# Patient Record
Sex: Male | Born: 1990
Health system: Southern US, Community
[De-identification: ages and names within clinical notes are randomized; demographics above are authoritative.]

## PROBLEM LIST (undated history)

## (undated) DIAGNOSIS — K589 Irritable bowel syndrome without diarrhea: Secondary | ICD-10-CM

## (undated) DIAGNOSIS — I10 Essential (primary) hypertension: Secondary | ICD-10-CM

## (undated) HISTORY — DX: Irritable bowel syndrome, unspecified: K58.9

## (undated) HISTORY — PX: TONSILLECTOMY: SUR1361

## (undated) HISTORY — PX: WISDOM TOOTH EXTRACTION: SHX21

## (undated) HISTORY — PX: APPENDECTOMY: SHX54

## (undated) SURGERY — EGD (ESOPHAGOGASTRODUODENOSCOPY)
Anesthesia: Moderate Sedation

---
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
## 2009-07-25 ENCOUNTER — Emergency Department: Payer: Self-pay | Admitting: Emergency Medicine

## 2009-10-19 ENCOUNTER — Emergency Department: Payer: Self-pay | Admitting: Emergency Medicine

## 2010-05-14 ENCOUNTER — Emergency Department: Payer: Self-pay | Admitting: Emergency Medicine

## 2011-10-23 ENCOUNTER — Emergency Department: Payer: Self-pay | Admitting: *Deleted

## 2012-01-01 ENCOUNTER — Emergency Department (INDEPENDENT_AMBULATORY_CARE_PROVIDER_SITE_OTHER)
Admission: EM | Admit: 2012-01-01 | Discharge: 2012-01-01 | Disposition: A | Discharge status: Home / Self Care | Payer: Self-pay | Source: Home / Self Care

## 2012-01-01 ENCOUNTER — Encounter (HOSPITAL_COMMUNITY): Payer: Self-pay | Admitting: Emergency Medicine

## 2012-01-01 ENCOUNTER — Emergency Department (HOSPITAL_COMMUNITY)
Admission: EM | Admit: 2012-01-01 | Discharge: 2012-01-01 | Disposition: A | Discharge status: Home / Self Care | Payer: Self-pay | Attending: Emergency Medicine | Admitting: Emergency Medicine

## 2012-01-01 DIAGNOSIS — IMO0002 Reserved for concepts with insufficient information to code with codable children: Secondary | ICD-10-CM | POA: Insufficient documentation

## 2012-01-01 DIAGNOSIS — I10 Essential (primary) hypertension: Secondary | ICD-10-CM | POA: Insufficient documentation

## 2012-01-01 DIAGNOSIS — Y9389 Activity, other specified: Secondary | ICD-10-CM | POA: Insufficient documentation

## 2012-01-01 DIAGNOSIS — K222 Esophageal obstruction: Secondary | ICD-10-CM

## 2012-01-01 DIAGNOSIS — F17200 Nicotine dependence, unspecified, uncomplicated: Secondary | ICD-10-CM | POA: Insufficient documentation

## 2012-01-01 DIAGNOSIS — T18108A Unspecified foreign body in esophagus causing other injury, initial encounter: Secondary | ICD-10-CM | POA: Insufficient documentation

## 2012-01-01 DIAGNOSIS — Y929 Unspecified place or not applicable: Secondary | ICD-10-CM | POA: Insufficient documentation

## 2012-01-01 HISTORY — DX: Essential (primary) hypertension: I10

## 2012-01-01 MED ORDER — DIAZEPAM 5 MG/ML IJ SOLN
5.0000 mg | Freq: Once | INTRAMUSCULAR | Status: AC
  Administered 2012-01-01: 5 mg via INTRAVENOUS
  Filled 2012-01-01: qty 2

## 2012-01-01 MED ORDER — GLUCAGON HCL (RDNA) 1 MG IJ SOLR
1.0000 mg | Freq: Once | INTRAMUSCULAR | Status: AC
  Administered 2012-01-01: 1 mg via INTRAVENOUS
  Filled 2012-01-01: qty 1

## 2012-01-01 NOTE — ED Provider Notes (Signed)
History     CSN: [id number]  Arrival date & time 01/01/12  1330   None     Chief Complaint  Patient presents with  . Cough    (Consider location/radiation/quality/duration/timing/severity/associated sxs/prior treatment) HPI Comments: Pleasant 21 year old male who was attempting to be a small piece of chicken this morning and upon swallowing it he immediately regurgitated it. He saw some speck of blood on the initial regurgitation but no bleeding since then. He later attempted to sip on some hot tea but was unable to keep that down low and then just a few seconds he quickly regurgitated that. He is also tried to drink water and that "comes right back up". He is unable to maintain control of salivary secretions without regurgitating that. He was given a cup of water in the office after 2 swallows he had to gag himself to regurgitate the water because it was causing some chest discomfort. He has no shortness of breath, cough, fever or upper respiratory infection symptoms. Denies abdominal pain.   Past Medical History  Diagnosis Date  . Hypertension     Past Surgical History  Procedure Date  . Appendectomy   . Tonsillectomy     No family history on file.  History  Substance Use Topics  . Smoking status: Current Some Day Smoker  . Smokeless tobacco: Not on file  . Alcohol Use: Yes      Review of Systems  Gastrointestinal:       As per history of present illness  All other systems reviewed and are negative.    Allergies  Review of patient's allergies indicates no known allergies.  Home Medications  No current outpatient prescriptions on file.  BP 159/97  Pulse 85  Temp 98 F (36.7 C) (Oral)  Resp 20  SpO2 98%  Physical Exam  Constitutional: He is oriented to person, place, and time. He appears well-developed and well-nourished. No distress.  HENT:  Head: Normocephalic and atraumatic.  Right Ear: External ear normal.  Left Ear: External ear normal.    Mouth/Throat: Oropharynx is clear and moist. No oropharyngeal exudate.  Eyes: Conjunctivae normal and EOM are normal.  Neck: Normal range of motion. Neck supple.  Cardiovascular: Normal rate, regular rhythm and normal heart sounds.   Pulmonary/Chest: Effort normal and breath sounds normal. No respiratory distress. He has no wheezes.  Abdominal: Soft. He exhibits no distension and no mass. There is no rebound and no guarding.       Mild generalized tenderness of the abdomen a little more so in the epigastrium.  Musculoskeletal: Normal range of motion. He exhibits no edema and no tenderness.  Lymphadenopathy:    He has no cervical adenopathy.  Neurological: He is alert and oriented to person, place, and time. No cranial nerve deficit.  Skin: Skin is warm and dry. No rash noted.  Psychiatric: He has a normal mood and affect.    ED Course  Procedures (including critical care time)  Labs Reviewed - No data to display No results found.   1. Acute esophageal obstruction       MDM  Into the emergency department for evaluation and management of what is most likely an esophageal obstruction. He is in no distress. He is stable. He is maintaining his airway quite well. He is active awake alert without shortness of breath, cough or other airway or pulmonary symptoms.        Hayden Rasmussen, NP 01/01/12 [phone]

## 2012-01-01 NOTE — ED Notes (Signed)
Eating bite sized pieces of chicken this am, after first bite , has not been able to hold down liquids or food.  Denies nausea.

## 2012-01-01 NOTE — ED Provider Notes (Signed)
History     CSN: [id number]  Arrival date & time 01/01/12  1423   First MD Initiated Contact with Patient 01/01/12 1505      No chief complaint on file.   (Consider location/radiation/quality/duration/timing/severity/associated sxs/prior treatment) HPI Comments: Patient presents with inability to swallow food, liquid, or saliva since eating a piece of chicken this morning.  Anything he eats or drinks he throws back up.  He was seen at urgent care and sent here.  He denies fevers or chills.  This has happened in the past but has never lasted this long.  The history is provided by the patient.    Past Medical History  Diagnosis Date  . Hypertension     Past Surgical History  Procedure Date  . Appendectomy   . Tonsillectomy     No family history on file.  History  Substance Use Topics  . Smoking status: Current Some Day Smoker  . Smokeless tobacco: Not on file  . Alcohol Use: Yes      Review of Systems  All other systems reviewed and are negative.    Allergies  Review of patient's allergies indicates no known allergies.  Home Medications  No current outpatient prescriptions on file.  BP 160/93  Pulse 97  Temp 98.2 F (36.8 C)  Resp 16  SpO2 98%  Physical Exam  Nursing note and vitals reviewed. Constitutional: He is oriented to person, place, and time. He appears well-developed and well-nourished. No distress.  HENT:  Head: Normocephalic and atraumatic.  Mouth/Throat: Oropharynx is clear and moist. No oropharyngeal exudate.  Neck: Normal range of motion.  Cardiovascular: Normal rate and regular rhythm.   Pulmonary/Chest: Effort normal and breath sounds normal.  Abdominal: Soft. Bowel sounds are normal.  Musculoskeletal: Normal range of motion.  Neurological: He is alert and oriented to person, place, and time.  Skin: Skin is warm and dry. He is not diaphoretic.    ED Course  Procedures (including critical care time)  Labs Reviewed - No data to  display No results found.   No diagnosis found.    MDM  Was given glucagon, valium, and cola which resolved the obstruction.  He is now able to take fluids without difficulty.  Will discharge to home, to return prn.  Needs to see GI as this has occurred before.  Will give GI contact information.        Geoffery Lyons, MD 01/01/12 [phone]

## 2012-01-01 NOTE — ED Notes (Signed)
Pt given a can of coke to drink, pt was able to swallow fluids with out gagging or spitting them back up. Pt reports he feels a lot better and doesn't have the feeling like something is stuck in his throat anymore.

## 2012-01-01 NOTE — ED Notes (Signed)
Pt reports he isn't able to keep any food or fluids down. Pt denies nausea. Pt c/o hard time getting food and fluids down to stomach, when he drinks water he spits it up immediately. Pt denies throat pain. Pt able to speak in full sentences, pt denies SOB. Pt reports it feels like something needs to come out.

## 2012-01-01 NOTE — ED Notes (Signed)
Throat pain  Since this am went to ucc and was sent for further test ? Throat stricture is able to swallow saliva and breath cannot swallow food

## 2012-01-01 NOTE — ED Provider Notes (Signed)
Medical screening examination/treatment/procedure(s) were performed by resident physician or non-physician practitioner and as supervising physician I was immediately available for consultation/collaboration.   Larrie Fraizer DOUGLAS MD.    Japneet Staggs D Moriya Mitchell, MD 01/01/12 1656 

## 2012-07-18 ENCOUNTER — Emergency Department: Payer: Self-pay | Admitting: Emergency Medicine

## 2012-07-18 LAB — BASIC METABOLIC PANEL
Calcium, Total: 9.1 mg/dL (ref 8.5–10.1)
Creatinine: 0.94 mg/dL (ref 0.60–1.30)
Glucose: 102 mg/dL — ABNORMAL HIGH (ref 65–99)
Sodium: 141 mmol/L (ref 136–145)

## 2012-07-18 LAB — CBC
HCT: 43.6 % (ref 40.0–52.0)
HGB: 15 g/dL (ref 13.0–18.0)
MCH: 32.6 pg (ref 26.0–34.0)
MCHC: 34.3 g/dL (ref 32.0–36.0)
Platelet: 227 10*3/uL (ref 150–440)
WBC: 7.6 10*3/uL (ref 3.8–10.6)

## 2012-10-23 ENCOUNTER — Emergency Department (HOSPITAL_COMMUNITY)
Admission: EM | Admit: 2012-10-23 | Discharge: 2012-10-23 | Disposition: A | Discharge status: Home / Self Care | Payer: 59 | Attending: Emergency Medicine | Admitting: Emergency Medicine

## 2012-10-23 ENCOUNTER — Encounter (HOSPITAL_COMMUNITY): Payer: Self-pay | Admitting: Emergency Medicine

## 2012-10-23 DIAGNOSIS — J069 Acute upper respiratory infection, unspecified: Secondary | ICD-10-CM | POA: Insufficient documentation

## 2012-10-23 DIAGNOSIS — R519 Headache, unspecified: Secondary | ICD-10-CM

## 2012-10-23 DIAGNOSIS — R6883 Chills (without fever): Secondary | ICD-10-CM | POA: Insufficient documentation

## 2012-10-23 DIAGNOSIS — F17200 Nicotine dependence, unspecified, uncomplicated: Secondary | ICD-10-CM | POA: Insufficient documentation

## 2012-10-23 DIAGNOSIS — I10 Essential (primary) hypertension: Secondary | ICD-10-CM | POA: Insufficient documentation

## 2012-10-23 DIAGNOSIS — R51 Headache: Secondary | ICD-10-CM | POA: Insufficient documentation

## 2012-10-23 MED ORDER — DIPHENHYDRAMINE HCL 50 MG/ML IJ SOLN
25.0000 mg | Freq: Once | INTRAMUSCULAR | Status: AC
  Administered 2012-10-23: 25 mg via INTRAVENOUS
  Filled 2012-10-23: qty 1

## 2012-10-23 MED ORDER — SALINE SPRAY 0.65 % NA SOLN
1.0000 | Freq: Once | NASAL | Status: AC
  Administered 2012-10-23: 1 via NASAL
  Filled 2012-10-23: qty 44

## 2012-10-23 MED ORDER — METOCLOPRAMIDE HCL 5 MG/ML IJ SOLN
5.0000 mg | Freq: Once | INTRAMUSCULAR | Status: AC
  Administered 2012-10-23: 5 mg via INTRAVENOUS
  Filled 2012-10-23: qty 2

## 2012-10-23 MED ORDER — SODIUM CHLORIDE 0.9 % IV BOLUS (SEPSIS)
1000.0000 mL | Freq: Once | INTRAVENOUS | Status: AC
  Administered 2012-10-23: 1000 mL via INTRAVENOUS

## 2012-10-23 NOTE — ED Provider Notes (Signed)
CSN: [id number]     Arrival date & time 10/23/12  0854 History   First MD Initiated Contact with Patient 10/23/12 [phone]     Chief Complaint  Patient presents with  . Headache   (Consider location/radiation/quality/duration/timing/severity/associated sxs/prior Treatment) HPI  This a 22 year old male with a past medical history significant for hypertension who presents with headache. Patient reports onset of headache, chills, and nasal congestion on Sunday. He states he forgot Sunday night with a headache and took a shower. He received a flu shot on Monday morning at work as he is a Research scientist (physical sciences) at [employer]. Patient states that since he got a flu shot he has had continuous frontal headache. He denies any fevers. He has tried Tylenol, Sudafed, and Mucinex for his symptoms. Patient denies any vision changes, focal weakness, or numbness. Patient further denies any shortness of breath, chest pain, abdominal pain, urinary symptoms.  Past Medical History  Diagnosis Date  . Hypertension    Past Surgical History  Procedure Laterality Date  . Appendectomy    . Tonsillectomy     History reviewed. No pertinent family history. History  Substance Use Topics  . Smoking status: Current Some Day Smoker  . Smokeless tobacco: Not on file  . Alcohol Use: Yes    Review of Systems  Constitutional: Positive for chills. Negative for fever.  HENT: Positive for congestion and rhinorrhea. Negative for ear pain, neck pain and neck stiffness.   Eyes: Negative for visual disturbance.  Respiratory: Negative.  Negative for chest tightness and shortness of breath.   Cardiovascular: Negative.  Negative for chest pain.  Gastrointestinal: Negative.  Negative for nausea, vomiting and abdominal pain.  Genitourinary: Negative.   Musculoskeletal: Negative for myalgias.  Skin: Negative for rash.  Neurological: Positive for headaches. Negative for dizziness, syncope and weakness.  All other systems  reviewed and are negative.    Allergies  Review of patient's allergies indicates no known allergies.  Home Medications   Current Outpatient Rx  Name  Route  Sig  Dispense  Refill  . acetaminophen (TYLENOL) 500 MG tablet   Oral   Take 1,000 mg by mouth every 6 (six) hours as needed for pain.         Marland Kitchen guaiFENesin (ROBITUSSIN) 100 MG/5ML liquid   Oral   Take 300 mg by mouth 3 (three) times daily as needed for cough.         . pseudoephedrine (SUDAFED) 30 MG tablet   Oral   Take 30 mg by mouth every 4 (four) hours as needed for congestion.          BP 151/87  Pulse 68  Temp(Src) 98.4 F (36.9 C) (Oral)  Resp 22  SpO2 100% Physical Exam  Nursing note and vitals reviewed. Constitutional: He is oriented to person, place, and time. He appears well-developed and well-nourished.  HENT:  Head: Normocephalic and atraumatic.  Left Ear: External ear normal.  Mouth/Throat: Oropharynx is clear and moist.  Tenderness palpation over the frontal sinus, right TM with scarring  Eyes: EOM are normal. Pupils are equal, round, and reactive to light.  Neck: Neck supple.  No meningismus  Cardiovascular: Normal rate, regular rhythm and normal heart sounds.   No murmur heard. Pulmonary/Chest: Effort normal and breath sounds normal. No respiratory distress. He has no wheezes.  Abdominal: Soft. Bowel sounds are normal. There is no tenderness. There is no rebound.  Musculoskeletal: He exhibits no edema.  Lymphadenopathy:    He has no cervical  adenopathy.  Neurological: He is alert and oriented to person, place, and time. No cranial nerve deficit. Coordination normal.  Coordination intact finger-nose-finger, 5 out of 5 strength in all 4 extremities, visual fields intact  Skin: Skin is warm and dry. No rash noted.  Psychiatric: He has a normal mood and affect.    ED Course  Procedures (including critical care time) Labs Review Labs Reviewed - No data to display Imaging Review No  results found.  MDM   1. URI (upper respiratory infection)   2. Headache    The patient is nonfocal and nontoxic appearing on exam. Symptoms suggestive of upper respiratory infection.  Patient denies any neck pain or meningismus. He denies worse headache of his life. Patient was given headache cocktail and saline nasal spray. Patient had complete resolution of his symptoms in the ER.  Patient was educated on symptomatic management. He will be discharged home  After history, exam, and medical workup I feel the patient has been appropriately medically screened and is safe for discharge home. Pertinent diagnoses were discussed with the patient. Patient was given return precautions.   Shon Baton, MD 10/23/12 [phone]

## 2012-10-23 NOTE — ED Notes (Signed)
Pt arrives to ed c/o HA onset Sunday. Pt sts "cold then hot" at night. Pt sts nasal drainage x 2 days. Pt denies hx migraines, n/v/d, change in vision, recent illness/injury. Caox4, pmsx4, nad.

## 2013-07-29 ENCOUNTER — Emergency Department (HOSPITAL_COMMUNITY)
Admission: EM | Admit: 2013-07-29 | Discharge: 2013-07-29 | Disposition: A | Discharge status: Home / Self Care | Payer: 59 | Attending: Emergency Medicine | Admitting: Emergency Medicine

## 2013-07-29 ENCOUNTER — Encounter (HOSPITAL_COMMUNITY): Payer: Self-pay | Admitting: Emergency Medicine

## 2013-07-29 DIAGNOSIS — Y9289 Other specified places as the place of occurrence of the external cause: Secondary | ICD-10-CM | POA: Insufficient documentation

## 2013-07-29 DIAGNOSIS — IMO0002 Reserved for concepts with insufficient information to code with codable children: Secondary | ICD-10-CM | POA: Insufficient documentation

## 2013-07-29 DIAGNOSIS — Y9389 Activity, other specified: Secondary | ICD-10-CM | POA: Insufficient documentation

## 2013-07-29 DIAGNOSIS — K222 Esophageal obstruction: Secondary | ICD-10-CM

## 2013-07-29 DIAGNOSIS — I10 Essential (primary) hypertension: Secondary | ICD-10-CM | POA: Insufficient documentation

## 2013-07-29 DIAGNOSIS — T18128A Food in esophagus causing other injury, initial encounter: Secondary | ICD-10-CM

## 2013-07-29 DIAGNOSIS — R1110 Vomiting, unspecified: Secondary | ICD-10-CM | POA: Insufficient documentation

## 2013-07-29 DIAGNOSIS — T18108A Unspecified foreign body in esophagus causing other injury, initial encounter: Secondary | ICD-10-CM | POA: Insufficient documentation

## 2013-07-29 MED ORDER — GLUCAGON HCL RDNA (DIAGNOSTIC) 1 MG IJ SOLR
1.0000 mg | Freq: Once | INTRAMUSCULAR | Status: AC
  Administered 2013-07-29: 1 mg via INTRAMUSCULAR
  Filled 2013-07-29: qty 1

## 2013-07-29 NOTE — Discharge Instructions (Signed)
Take small bites and eat slowly. Make appointment to followup with gastroenterologist. Return immediately for persistent vomiting or for any concerns.

## 2013-07-29 NOTE — ED Notes (Addendum)
Patient presents stating he has something stuck in his throat.  ?chicken or rice  No resp distress noted  Patient unable to swallow all his mucous

## 2013-07-29 NOTE — ED Provider Notes (Signed)
CSN: [id number]     Arrival date & time 07/29/13  0501 History   First MD Initiated Contact with Patient 07/29/13 [phone]     Chief Complaint  Patient presents with  . Food stuck in throat      (Consider location/radiation/quality/duration/timing/severity/associated sxs/prior Treatment) HPI Patient states he was eating chicken and rice around 4:30 this morning and felt it stuck in his throat. This has happened in the past. States he is unable to eat or drink anything. Tolerating secretions. He has no respiratory distress is not complaining of any shortness of breath. Has never seen a gastroenterologist or had endoscopy. Past Medical History  Diagnosis Date  . Hypertension    Past Surgical History  Procedure Laterality Date  . Appendectomy    . Tonsillectomy     History reviewed. No pertinent family history. History  Substance Use Topics  . Smoking status: Current Some Day Smoker  . Smokeless tobacco: Not on file  . Alcohol Use: Yes    Review of Systems  Constitutional: Negative for fever and chills.  HENT: Positive for trouble swallowing.   Respiratory: Negative for cough, shortness of breath, wheezing and stridor.   Cardiovascular: Negative for chest pain, palpitations and leg swelling.  Gastrointestinal: Positive for vomiting. Negative for nausea, abdominal pain and diarrhea.  Genitourinary: Negative for dysuria.  Musculoskeletal: Negative for back pain, neck pain and neck stiffness.  Skin: Negative for rash and wound.  Neurological: Negative for dizziness, weakness, light-headedness, numbness and headaches.  All other systems reviewed and are negative.     Allergies  Review of patient's allergies indicates no known allergies.  Home Medications   Prior to Admission medications   Medication Sig Start Date End Date Taking? Authorizing Shellsea Borunda  acetaminophen (TYLENOL) 500 MG tablet Take 1,000 mg by mouth every 6 (six) hours as needed for pain.    Historical Clif Serio,  MD  guaiFENesin (ROBITUSSIN) 100 MG/5ML liquid Take 300 mg by mouth 3 (three) times daily as needed for cough.    Historical Jerald Hennington, MD  pseudoephedrine (SUDAFED) 30 MG tablet Take 30 mg by mouth every 4 (four) hours as needed for congestion.    Historical Tyrick Dunagan, MD   BP 147/81  Pulse 87  Temp(Src) 98.6 F (37 C) (Oral)  Resp 17  Ht 6\' 2"  (1.88 m)  Wt 188 lb (85.276 kg)  BMI 24.13 kg/m2  SpO2 100% Physical Exam  Nursing note and vitals reviewed. Constitutional: He is oriented to person, place, and time. He appears well-developed and well-nourished. No distress.  Speaking clearly. Tolerating secretions. In no apparent distress.  HENT:  Head: Normocephalic and atraumatic.  Mouth/Throat: Oropharynx is clear and moist.  Eyes: EOM are normal. Pupils are equal, round, and reactive to light.  Neck: Normal range of motion. Neck supple.  Cardiovascular: Normal rate and regular rhythm.  Exam reveals no gallop and no friction rub.   No murmur heard. Pulmonary/Chest: Effort normal and breath sounds normal. No respiratory distress. He has no wheezes. He has no rales. He exhibits no tenderness.  Abdominal: Soft. Bowel sounds are normal. He exhibits no distension and no mass. There is no tenderness. There is no rebound and no guarding.  Musculoskeletal: Normal range of motion. He exhibits no edema and no tenderness.  Neurological: He is alert and oriented to person, place, and time.  Skin: Skin is warm and dry. No rash noted. No erythema.  Psychiatric: He has a normal mood and affect. His behavior is normal.    ED  Course  Procedures (including critical care time) Labs Review Labs Reviewed - No data to display  Imaging Review No results found.   EKG Interpretation None      MDM   Final diagnoses:  None      Patient is tolerating fluids. No more vomiting in the emergency department. States he feels the food has passed. He has been advised to eat small bites slowly. Given the  recurrent nature of these obstructions, i believe he should also followup with gastroenterology. He is aware of this. Return precautions have been given and the patient is voiced understanding.  Loren Raceravid Yelverton, MD 07/29/13 [phone]

## 2013-08-12 ENCOUNTER — Other Ambulatory Visit: Payer: Self-pay | Admitting: Family Medicine

## 2013-08-12 LAB — CBC WITH DIFFERENTIAL/PLATELET
BASOS ABS: 0.1 10*3/uL (ref 0.0–0.1)
Basophil %: 1.2 %
EOS ABS: 0.4 10*3/uL (ref 0.0–0.7)
EOS PCT: 6.3 %
HCT: 43.4 % (ref 40.0–52.0)
HGB: 14.5 g/dL (ref 13.0–18.0)
LYMPHS ABS: 2.5 10*3/uL (ref 1.0–3.6)
LYMPHS PCT: 35.1 %
MCH: 32.7 pg (ref 26.0–34.0)
MCHC: 33.4 g/dL (ref 32.0–36.0)
MCV: 98 fL (ref 80–100)
MONO ABS: 0.4 x10 3/mm (ref 0.2–1.0)
MONOS PCT: 5.6 %
NEUTROS PCT: 51.8 %
Neutrophil #: 3.6 10*3/uL (ref 1.4–6.5)
PLATELETS: 261 10*3/uL (ref 150–440)
RBC: 4.43 10*6/uL (ref 4.40–5.90)
RDW: 13 % (ref 11.5–14.5)
WBC: 7 10*3/uL (ref 3.8–10.6)

## 2013-08-12 LAB — COMPREHENSIVE METABOLIC PANEL
ALBUMIN: 4 g/dL (ref 3.4–5.0)
ALK PHOS: 45 U/L
ALT: 29 U/L (ref 12–78)
Anion Gap: 7 (ref 7–16)
BUN: 15 mg/dL (ref 7–18)
Bilirubin,Total: 0.3 mg/dL (ref 0.2–1.0)
CALCIUM: 9.2 mg/dL (ref 8.5–10.1)
CO2: 30 mmol/L (ref 21–32)
CREATININE: 1.04 mg/dL (ref 0.60–1.30)
Chloride: 103 mmol/L (ref 98–107)
EGFR (African American): >60
EGFR (Non-African Amer.): >60
Glucose: 90 mg/dL (ref 65–99)
Osmolality: 280 (ref 275–301)
POTASSIUM: 3.9 mmol/L (ref 3.5–5.1)
SGOT(AST): 18 U/L (ref 15–37)
SODIUM: 140 mmol/L (ref 136–145)
TOTAL PROTEIN: 7.4 g/dL (ref 6.4–8.2)

## 2013-08-12 LAB — LIPID PANEL
Cholesterol: 154 mg/dL (ref 0–200)
HDL Cholesterol: 51 mg/dL (ref 40–60)
Ldl Cholesterol, Calc: 93 mg/dL (ref 0–100)
Triglycerides: 52 mg/dL (ref 0–200)
VLDL CHOLESTEROL, CALC: 10 mg/dL (ref 5–40)

## 2013-08-12 LAB — TSH: Thyroid Stimulating Horm: 1.19 u[IU]/mL

## 2013-08-12 LAB — HEMOGLOBIN A1C: HEMOGLOBIN A1C: 5.8 % (ref 4.2–6.3)

## 2014-07-28 ENCOUNTER — Emergency Department (HOSPITAL_COMMUNITY): Payer: 59 | Admitting: Certified Registered"

## 2014-07-28 ENCOUNTER — Ambulatory Visit (HOSPITAL_COMMUNITY)
Admission: EM | Admit: 2014-07-28 | Discharge: 2014-07-28 | Disposition: A | Discharge status: Home / Self Care | Payer: 59 | Attending: Emergency Medicine | Admitting: Emergency Medicine

## 2014-07-28 ENCOUNTER — Encounter (HOSPITAL_COMMUNITY)
Admission: EM | Disposition: A | Discharge status: Home / Self Care | Payer: Self-pay | Source: Home / Self Care | Attending: Emergency Medicine

## 2014-07-28 ENCOUNTER — Encounter (HOSPITAL_COMMUNITY): Payer: Self-pay | Admitting: Emergency Medicine

## 2014-07-28 DIAGNOSIS — I10 Essential (primary) hypertension: Secondary | ICD-10-CM | POA: Insufficient documentation

## 2014-07-28 DIAGNOSIS — R1310 Dysphagia, unspecified: Secondary | ICD-10-CM

## 2014-07-28 DIAGNOSIS — X58XXXA Exposure to other specified factors, initial encounter: Secondary | ICD-10-CM | POA: Diagnosis not present

## 2014-07-28 DIAGNOSIS — T18128A Food in esophagus causing other injury, initial encounter: Secondary | ICD-10-CM | POA: Insufficient documentation

## 2014-07-28 DIAGNOSIS — Z87891 Personal history of nicotine dependence: Secondary | ICD-10-CM | POA: Insufficient documentation

## 2014-07-28 HISTORY — PX: FOREIGN BODY REMOVAL: SHX962

## 2014-07-28 HISTORY — PX: ESOPHAGOGASTRODUODENOSCOPY: SHX5428

## 2014-07-28 SURGERY — EGD (ESOPHAGOGASTRODUODENOSCOPY)
Anesthesia: Monitor Anesthesia Care

## 2014-07-28 MED ORDER — LACTATED RINGERS IV SOLN
INTRAVENOUS | Status: DC | PRN
  Administered 2014-07-28: 13:00:00 via INTRAVENOUS

## 2014-07-28 MED ORDER — GI COCKTAIL ~~LOC~~
30.0000 mL | Freq: Once | ORAL | Status: AC
  Administered 2014-07-28: 30 mL via ORAL
  Filled 2014-07-28: qty 30

## 2014-07-28 MED ORDER — NITROGLYCERIN 0.4 MG SL SUBL
0.4000 mg | SUBLINGUAL_TABLET | SUBLINGUAL | Status: DC | PRN
  Administered 2014-07-28: 0.4 mg via SUBLINGUAL
  Filled 2014-07-28: qty 1

## 2014-07-28 MED ORDER — SODIUM CHLORIDE 0.9 % IV SOLN: INTRAVENOUS | Status: DC

## 2014-07-28 MED ORDER — GLUCAGON HCL RDNA (DIAGNOSTIC) 1 MG IJ SOLR
1.0000 mg | Freq: Once | INTRAMUSCULAR | Status: AC
  Administered 2014-07-28: 1 mg via INTRAVENOUS
  Filled 2014-07-28: qty 1

## 2014-07-28 MED ORDER — PROPOFOL INFUSION 10 MG/ML OPTIME
INTRAVENOUS | Status: DC | PRN
  Administered 2014-07-28: 75 ug/kg/min via INTRAVENOUS

## 2014-07-28 MED ORDER — PROPOFOL 10 MG/ML IV BOLUS
INTRAVENOUS | Status: DC | PRN
  Administered 2014-07-28: 20 mg via INTRAVENOUS
  Administered 2014-07-28: 30 mg via INTRAVENOUS

## 2014-07-28 MED ORDER — MIDAZOLAM HCL 2 MG/2ML IJ SOLN
INTRAMUSCULAR | Status: DC | PRN
  Administered 2014-07-28: 2 mg via INTRAVENOUS

## 2014-07-28 MED ORDER — SODIUM CHLORIDE 0.9 % IV BOLUS (SEPSIS)
500.0000 mL | Freq: Once | INTRAVENOUS | Status: AC
  Administered 2014-07-28: 500 mL via INTRAVENOUS

## 2014-07-28 MED ORDER — LIDOCAINE HCL (CARDIAC) 20 MG/ML IV SOLN
INTRAVENOUS | Status: DC | PRN
  Administered 2014-07-28: 60 mg via INTRAVENOUS

## 2014-07-28 NOTE — ED Notes (Signed)
Pt reports feeling like chicken from dinner is stuck in throat.  Resp e/u. NAD noted at this time.

## 2014-07-28 NOTE — ED Provider Notes (Signed)
CSN: [id number]     Arrival date & time 07/28/14  0258 History   First MD Initiated Contact with Patient 07/28/14 0601     Chief Complaint  Patient presents with  . Dysphagia     (Consider location/radiation/quality/duration/timing/severity/associated sxs/prior Treatment) HPI   24 year old male presents for evaluation of dysphagia. Patient states last night around 6:00 he was eating boneless chicken. Shortly afterward he was having trouble with a chest discomfort and felt that something is stuck in his throat. He tries to swallow water and solid but ended up spitting it up. He has had similar episodes like this in the past and was told that it is esophagus spasm. It usually resolved after he relaxes. He try relaxing throughout the night but woke up with the same discomfort. He cannot tolerates his saliva. He denies having any fever, chest pain, abdominal pain, back pain, trouble breathing, lightheadedness, dizziness. He was referred to a GI specialist in the past with symptoms usually resolve and has not been seen by specialist.  Past Medical History  Diagnosis Date  . Hypertension    Past Surgical History  Procedure Laterality Date  . Appendectomy    . Tonsillectomy     No family history on file. History  Substance Use Topics  . Smoking status: Former Games developer  . Smokeless tobacco: Not on file  . Alcohol Use: No    Review of Systems  All other systems reviewed and are negative.     Allergies  Review of patient's allergies indicates no known allergies.  Home Medications   Prior to Admission medications   Medication Sig Start Date End Date Taking? Authorizing Provider  PRESCRIPTION MEDICATION Take 1 tablet by mouth daily. Blood Pressure medication   Yes Historical Provider, MD   BP 141/82 mmHg  Pulse 74  Temp(Src) 97.8 F (36.6 C) (Oral)  Resp 18  Ht 6\' 2"  (1.88 m)  Wt 175 lb (79.379 kg)  BMI 22.46 kg/m2  SpO2 100% Physical Exam  Constitutional: He appears  well-developed and well-nourished. No distress.  HENT:  Head: Atraumatic.  Mouth/Throat: Oropharynx is clear and moist.  Eyes: Conjunctivae are normal.  Neck: Neck supple. No JVD present. No tracheal deviation present. No thyromegaly present.  Cardiovascular: Normal rate and regular rhythm.   Pulmonary/Chest: Effort normal and breath sounds normal. No stridor.  Abdominal: Soft. There is no tenderness.  Lymphadenopathy:    He has no cervical adenopathy.  Neurological: He is alert.  Skin: No rash noted.  Psychiatric: He has a normal mood and affect.  Nursing note and vitals reviewed.   ED Course  Procedures (including critical care time)  6:17 AM Patient here with dysphagia, this is recurrent in nature. Suspect achalasia. He is currently unable to tolerate his his secretions. Initially did not tolerate GI cocktail. Will be given glucagon and will monitor.  Care discussed with Dr. Nicanor Alcon.  7:13 AM Patient reports some improvement after receiving his initial dose of glucagon. Patient will receive an additional glucagon.  7:56 AM Despite receiving the second dose of glucagon, patient perform a by mouth trial but unable to keep fluids down. Therefore, will consult GI for further evaluation.  8:08 AM I have consulted with GI specialist Dr. Bosie Clos who will attempt to contact his partner to see pt since Dr. Bosie Clos is booked up until 4pm today.   8:11 AM Dr. Bosie Clos sts Dr. Madilyn Fireman will be able to see pt today. Pt made aware of plan. Patient would likely have EGD for  further evaluation.  Labs Review Labs Reviewed - No data to display  Imaging Review No results found.   EKG Interpretation None      MDM   Final diagnoses:  Dysphagia    BP 133/85 mmHg  Pulse 64  Temp(Src) 97.5 F (36.4 C) (Oral)  Resp 17  Ht  (1.88 m)  Wt 175 lb (79.379 kg)  BMI 22.46 kg/m2  SpO2 100%     Fayrene Helper, PA-C 07/28/14 1523  April Palumbo, MD 07/30/14 2351

## 2014-07-28 NOTE — Consult Note (Signed)
    [facility] Consult Note  Referring Provider: No ref. provider found Primary Care Physician:  Default, Provider, MD Primary Gastroenterologist:  Dr.  Antony Contras Complaint: Food impacted in the esophagus HPI: Jason Hale is an 24 y.o. black male  who felt a piece of chicken that he had just swallowed feel obstructing his esophagus. It would not washed down with water and he had to regurgitate water back up. He is tried several more times with the same result. He came to the emergency room and did not respond to gluten gone. He has had this happen once before and did respond to gluten [name]. He has had other episodes were frequent*and what down on its own and did not require medical attention.  Past Medical History  Diagnosis Date  . Hypertension     Past Surgical History  Procedure Laterality Date  . Appendectomy    . Tonsillectomy       (Not in a hospital admission)  Allergies: No Known Allergies  History reviewed. No pertinent family history.  Social History:  reports that he has quit smoking. He does not have any smokeless tobacco history on file. He reports that he uses illicit drugs (Marijuana). He reports that he does not drink alcohol.  Review of Systems: negative except as above   Blood pressure 150/85, pulse 57, temperature 97.5 F (36.4 C), temperature source Oral, resp. rate 16, height 6\' 2"  (1.88 m), weight 79.379 kg (175 lb), SpO2 99 %. Head: Normocephalic, without obvious abnormality, atraumatic Neck: no adenopathy, no carotid bruit, no JVD, supple, symmetrical, trachea midline and thyroid not enlarged, symmetric, no tenderness/mass/nodules Resp: clear to auscultation bilaterally Cardio: regular rate and rhythm, S1, S2 normal, no murmur, click, rub or gallop GI: Abdomen soft nondistended with normoactive bowel sounds. No visible megaly masses or guarding Extremities: extremities normal, atraumatic, no cyanosis or edema  No results found for this or  any previous visit (from the past 48 hour(s)). No results found.  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 Risks including perforation aspiration and possible need for surgery were explained to the patient and he wished to proceed. [name] C 07/28/2014, 12:53 PM  Pager [phone] If no answer or after 5 PM call [phone]

## 2014-07-28 NOTE — Transfer of Care (Signed)
Immediate Anesthesia Transfer of Care Note  Patient: Jason Hale  Procedure(s) Performed: Procedure(s): ESOPHAGOGASTRODUODENOSCOPY (EGD) (N/A) FOREIGN BODY REMOVAL (N/A)  Patient Location: Endoscopy Unit  Anesthesia Type:MAC  Level of Consciousness: awake, alert  and oriented  Airway & Oxygen Therapy: Patient Spontanous Breathing and Patient connected to nasal cannula oxygen  Post-op Assessment: Report given to RN  Post vital signs: Reviewed and stable  Last Vitals:  Filed Vitals:   07/28/14 1112  BP: 150/85  Pulse: 57  Temp: 36.4 C  Resp: 16    Complications: No apparent anesthesia complications

## 2014-07-28 NOTE — Discharge Instructions (Signed)
Esophagogastroduodenoscopy °Care After °Refer to this sheet in the next few weeks. These instructions provide you with information on caring for yourself after your procedure. Your caregiver may also give you more specific instructions. Your treatment has been planned according to current medical practices, but problems sometimes occur. Call your caregiver if you have any problems or questions after your procedure.  °HOME CARE INSTRUCTIONS °· Do not eat or drink anything until the numbing medicine (local anesthetic) has worn off and your gag reflex has returned. You will know that the local anesthetic has worn off when you can swallow comfortably. °· Do not drive for 12 hours after the procedure or as directed by your caregiver. °· Only take medicines as directed by your caregiver. °SEEK MEDICAL CARE IF:  °· You cannot stop coughing. °· You are not urinating at all or less than usual. °SEEK IMMEDIATE MEDICAL CARE IF: °· You have difficulty swallowing. °· You cannot eat or drink. °· You have worsening throat or chest pain. °· You have dizziness, lightheadedness, or you faint. °· You have nausea or vomiting. °· You have chills. °· You have a fever. °· You have severe abdominal pain. °· You have black, tarry, or bloody stools. °Document Released: 01/09/2012 Document Reviewed: 01/09/2012 °ExitCare® Patient Information ©2015 [employer]. This information is not intended to replace advice given to you by your health care provider. Make sure you discuss any questions you have with your health care provider. ° °

## 2014-07-28 NOTE — Op Note (Signed)
[facility] [address] [place], [address]   ENDOSCOPY PROCEDURE REPORT  PATIENT: Jason Hale, Jason Hale  MR#: [id number] BIRTHDATE: [birth year] , 24  yrs. old GENDER: male ENDOSCOPIST: Dorena Cookey, MD REFERRED BY: PROCEDURE DATE:  08-20-2014 PROCEDURE: ASA CLASS: INDICATIONS:  food impaction in the esophagus MEDICATIONS: M A C TOPICAL ANESTHETIC: none  DESCRIPTION OF PROCEDURE: After the risks benefits and alternatives of the procedure were thoroughly explained, informed consent was obtained.  The PENTAX GASTOROSCOPE [id number] endoscope was introduced through the mouth and advanced to the      ecproximal stomach , Without limitations.  The instrument was slowly withdrawn as the mucosa was fully examined. Estimated blood loss is zero unless otherwise noted in this procedure report.    the proximal esophagus was normal. Within the distal esophagus there was around food bolus above the GE junction. It was pushed into the stomach with the scope with some trauma and bleeding. The patient became very combative during the procedure not left not to try to get a complete view of the stomach and duodenum.       retroflexion was not done          The scope was then withdrawn from the patient and the procedure completed.  COMPLICATIONS: There were no immediate complications.  ENDOSCOPIC IMPRESSION: impacted esophageal foreign body consistent with ingested chicken, dislodged into the stomach with the endoscope tip  RECOMMENDATIONS: soft mechanical diet, omeprazole 20 mg twice a day, follow-up with me in 3 or 4 weeks and set up elective EGD with dilatation  REPEAT EXAM: 3 or 4 weeks  eSigned:  Dorena Cookey, MD August 20, 2014 1:31 PM    CC:  CPT CODES: ICD CODES:  The ICD and CPT codes recommended by this software are interpretations from the data that the clinical staff has captured with the software.  The verification of the translation of this report to the  ICD and CPT codes and modifiers is the sole responsibility of the health care institution and practicing physician where this report was generated.  [employer] will not be held responsible for the validity of the ICD and CPT codes included on this report.  AMA assumes no liability for data contained or not contained herein. CPT is a Publishing rights manager of the [employer].

## 2014-07-28 NOTE — ED Notes (Signed)
Pt transported to ENDO, blank consent form sent up with patient and labels.  No details provided regarding procedure, unable to complete consent here in the ED

## 2014-07-28 NOTE — Anesthesia Postprocedure Evaluation (Signed)
  Anesthesia Post-op Note  Patient: Jason Hale  Procedure(s) Performed: Procedure(s): ESOPHAGOGASTRODUODENOSCOPY (EGD) (N/A) FOREIGN BODY REMOVAL (N/A)  Patient Location: Endoscopy Unit  Anesthesia Type:MAC  Level of Consciousness: awake, alert  and oriented  Airway and Oxygen Therapy: Patient Spontanous Breathing  Post-op Pain: none  Post-op Assessment: Post-op Vital signs reviewed, Patient's Cardiovascular Status Stable, Respiratory Function Stable, Patent Airway and Pain level controlled              Post-op Vital Signs: stable  Last Vitals:  Filed Vitals:   07/28/14 1400  BP: 133/85  Pulse: 64  Temp:   Resp: 17    Complications: No apparent anesthesia complications

## 2014-07-28 NOTE — Anesthesia Preprocedure Evaluation (Addendum)
Anesthesia Evaluation  Patient identified by MRN, date of birth, ID band Patient awake    Reviewed: Allergy & Precautions, NPO status , Patient's Chart, lab work & pertinent test results  Airway Mallampati: II  TM Distance: >3 FB Neck ROM: Full    Dental  (+) Teeth Intact, Dental Advisory Given   Pulmonary former smoker,  breath sounds clear to auscultation        Cardiovascular hypertension, Pt. on medications Rhythm:Regular Rate:Normal     Neuro/Psych    GI/Hepatic   Endo/Other    Renal/GU      Musculoskeletal   Abdominal   Peds  Hematology   Anesthesia Other Findings   Reproductive/Obstetrics                           Anesthesia Physical Anesthesia Plan  ASA: II  Anesthesia Plan: MAC   Post-op Pain Management:    Induction: Intravenous  Airway Management Planned: Simple Face Mask  Additional Equipment:   Intra-op Plan:   Post-operative Plan:   Informed Consent:   Plan Discussed with:   Anesthesia Plan Comments:         Anesthesia Quick Evaluation

## 2014-07-29 ENCOUNTER — Encounter (HOSPITAL_COMMUNITY): Payer: Self-pay | Admitting: Gastroenterology

## 2015-01-18 ENCOUNTER — Encounter: Payer: Self-pay | Admitting: Medical Oncology

## 2015-01-18 ENCOUNTER — Ambulatory Visit: Payer: Self-pay | Admitting: Physician Assistant

## 2015-01-18 ENCOUNTER — Encounter: Payer: Self-pay | Admitting: Physician Assistant

## 2015-01-18 ENCOUNTER — Emergency Department
Admission: EM | Admit: 2015-01-18 | Discharge: 2015-01-18 | Disposition: A | Discharge status: Home / Self Care | Payer: 59 | Attending: Emergency Medicine | Admitting: Emergency Medicine

## 2015-01-18 VITALS — BP 150/90 | HR 82 | Temp 98.4°F

## 2015-01-18 DIAGNOSIS — I10 Essential (primary) hypertension: Secondary | ICD-10-CM | POA: Diagnosis not present

## 2015-01-18 DIAGNOSIS — Z87891 Personal history of nicotine dependence: Secondary | ICD-10-CM | POA: Diagnosis not present

## 2015-01-18 DIAGNOSIS — R0789 Other chest pain: Secondary | ICD-10-CM | POA: Diagnosis not present

## 2015-01-18 DIAGNOSIS — M25512 Pain in left shoulder: Secondary | ICD-10-CM | POA: Insufficient documentation

## 2015-01-18 DIAGNOSIS — R05 Cough: Secondary | ICD-10-CM | POA: Insufficient documentation

## 2015-01-18 DIAGNOSIS — M542 Cervicalgia: Secondary | ICD-10-CM | POA: Insufficient documentation

## 2015-01-18 DIAGNOSIS — Z79899 Other long term (current) drug therapy: Secondary | ICD-10-CM | POA: Insufficient documentation

## 2015-01-18 LAB — BASIC METABOLIC PANEL
ANION GAP: 8 (ref 5–15)
BUN: 17 mg/dL (ref 6–20)
CHLORIDE: 104 mmol/L (ref 101–111)
CO2: 28 mmol/L (ref 22–32)
Calcium: 9.2 mg/dL (ref 8.9–10.3)
Creatinine, Ser: 1.01 mg/dL (ref 0.61–1.24)
GFR calc non Af Amer: >60 mL/min (ref >60)
GLUCOSE: 103 mg/dL — AB (ref 65–99)
Potassium: 3.6 mmol/L (ref 3.5–5.1)
Sodium: 140 mmol/L (ref 135–145)

## 2015-01-18 LAB — CBC WITH DIFFERENTIAL/PLATELET
Basophils Absolute: 0.1 10*3/uL (ref 0–0.1)
Basophils Relative: 1 %
Eosinophils Absolute: 0.4 10*3/uL (ref 0–0.7)
Eosinophils Relative: 6 %
HEMATOCRIT: 42.5 % (ref 40.0–52.0)
HEMOGLOBIN: 13.8 g/dL (ref 13.0–18.0)
LYMPHS ABS: 2.6 10*3/uL (ref 1.0–3.6)
LYMPHS PCT: 41 %
MCH: 31.4 pg (ref 26.0–34.0)
MCHC: 32.4 g/dL (ref 32.0–36.0)
MCV: 96.9 fL (ref 80.0–100.0)
MONO ABS: 0.5 10*3/uL (ref 0.2–1.0)
MONOS PCT: 8 %
NEUTROS ABS: 2.8 10*3/uL (ref 1.4–6.5)
NEUTROS PCT: 44 %
Platelets: 231 10*3/uL (ref 150–440)
RBC: 4.39 MIL/uL — ABNORMAL LOW (ref 4.40–5.90)
RDW: 13.9 % (ref 11.5–14.5)
WBC: 6.3 10*3/uL (ref 3.8–10.6)

## 2015-01-18 LAB — TROPONIN I: Troponin I: <0.03 ng/mL (ref <0.031)

## 2015-01-18 MED ORDER — IBUPROFEN 600 MG PO TABS
600.0000 mg | ORAL_TABLET | Freq: Once | ORAL | Status: AC
  Administered 2015-01-18: 600 mg via ORAL
  Filled 2015-01-18: qty 1

## 2015-01-18 MED ORDER — DIAZEPAM 5 MG PO TABS: 5.0000 mg | ORAL_TABLET | Freq: Three times a day (TID) | ORAL | Status: DC | PRN

## 2015-01-18 MED ORDER — ACETAMINOPHEN 325 MG PO TABS
650.0000 mg | ORAL_TABLET | Freq: Once | ORAL | Status: AC
  Administered 2015-01-18: 650 mg via ORAL
  Filled 2015-01-18: qty 2

## 2015-01-18 MED ORDER — NAPROXEN 500 MG PO TABS: 500.0000 mg | ORAL_TABLET | Freq: Two times a day (BID) | ORAL | Status: DC

## 2015-01-18 NOTE — ED Notes (Signed)
Work note given

## 2015-01-18 NOTE — Progress Notes (Signed)
S: pt c/o left sided neck and shoulder pain for 1.5 weeks, hurts really bad, some tingling into left arm, no known injury, also having occasional chest pains and sob; no known fever /chills but has had uri for about a month, gums bleed when brushing teeth, also saw a cardiologist when he was a kid but doesn't know why, family hx of CAD, father had multiple MIs  O: vitals with elevated bp at 150/90, pulse normal at 82, t normal at 98.4, nad, pt does not appear ill; ENT wnl, neck supple no lymph, lungs c t a, cv rrr, cspine nontender, left shoulder a little tender in trapezious, n/v intact, ekg shows multiple ST elevations with computer read of ?pericarditis  A: left shoulder pain, abnormal ekg  P: compared current ekg to previous ekg, had st elevations in anterior leads in 2014, concerns since pt has had uri for about a month and bleeding from gums which can cause bacteria to enter blood stream, sent pt to ER for eval, called and discussed with PA in flex, Kara MeadEmma; stated she would let charge RN know and took patient's name

## 2015-01-18 NOTE — ED Notes (Signed)
Pt to er from employee clinic where he was going bc of a cough that he has had x 1 month, and left sided chest pain with radiation of pain into left shoulder x 2 weeks, reports occasional shortness of breath with sharp pains.

## 2015-01-18 NOTE — Discharge Instructions (Signed)
You were prescribed a medication that is potentially sedating. Do not drink alcohol, drive or participate in any other potentially dangerous activities while taking this medication as it may make you sleepy. Do not take this medication with any other sedating medications, either prescription or over-the-counter. If you were prescribed Percocet or Vicodin, do not take these with acetaminophen (Tylenol) as it is already contained within these medications. °  °Opioid pain medications (or "narcotics") can be habit forming.  Use it as little as possible to achieve adequate pain control.  Do not use or use it with extreme caution if you have a history of opiate abuse or dependence.  If you are on a pain contract with your primary care doctor or a pain specialist, be sure to let them know you were prescribed this medication today from the [facility].  This medication is intended for your use only - do not give any to anyone else and keep it in a secure place where nobody else, especially children and pets, have access to it.  It will also cause or worsen constipation, so you may want to consider taking an over-the-counter stool softener while you are taking this medication. ° °

## 2015-01-18 NOTE — ED Provider Notes (Signed)
[facility] Emergency Department Provider [facility]____  Time seen: 9:35 AM  I have reviewed the triage vital signs and the nursing notes.   HISTORY  Chief Complaint Chest Pain and Cough    HPI Jason Hale is a 24 y.o. male who complains of left neck and shoulder pain and left upper chest pain for the past 2 weeks. Worsening, initially was intermittent but more constant over the last 2 days. Pain is achy and tight, radiates to the left upper arm. Not exertional, not pleuritic. No associated nausea vomiting diaphoresis dizziness or shortness of breath. It is worse with movement and turning to the left. Denies injuries. Reports noncompliance with antihypertensive for hypertension     Past Medical History  Diagnosis Date  . Hypertension      There are no active problems to display for this patient.    Past Surgical History  Procedure Laterality Date  . Appendectomy    . Tonsillectomy    . Esophagogastroduodenoscopy N/A 07/28/2014    Procedure: ESOPHAGOGASTRODUODENOSCOPY (EGD);  Surgeon: Dorena Cookey, MD;  Location: [facility] ENDOSCOPY;  Service: Endoscopy;  Laterality: N/A;  . Foreign body removal N/A 07/28/2014    Procedure: FOREIGN BODY REMOVAL;  Surgeon: Dorena Cookey, MD;  Location: [facility] ENDOSCOPY;  Service: Endoscopy;  Laterality: N/A;     Current Outpatient Rx  Name  Route  Sig  Dispense  Refill  . ibuprofen (ADVIL,MOTRIN) 200 MG tablet   Oral   Take 200 mg by mouth every 6 (six) hours as needed.         . diazepam (VALIUM) 5 MG tablet   Oral   Take 1 tablet (5 mg total) by mouth every 8 (eight) hours as needed for muscle spasms.   8 tablet   0   . lisinopril (PRINIVIL,ZESTRIL) 10 MG tablet   Oral   Take 10 mg by mouth daily.         . naproxen (NAPROSYN) 500 MG tablet   Oral   Take 1 tablet (500 mg total) by mouth 2 (two) times daily with a meal.   20 tablet   0   . PRESCRIPTION MEDICATION   Oral    Take 1 tablet by mouth daily. Blood Pressure medication            Allergies Review of patient's allergies indicates no known allergies.   No family history on file.  Social History Social History  Substance Use Topics  . Smoking status: Former Games developer  . Smokeless tobacco: None  . Alcohol Use: No    Review of Systems  Constitutional:   No fever or chills. No weight changes Eyes:   No blurry vision or double vision.  ENT:   No sore throat. Cardiovascular:   Positive as above chest pain. Respiratory:   No dyspnea or cough. Gastrointestinal:   Negative for abdominal pain, vomiting and diarrhea.  No BRBPR or melena. Genitourinary:   Negative for dysuria, urinary retention, bloody urine, or difficulty urinating. Musculoskeletal:   Negative for back pain. Left shoulder pain. Skin:   Negative for rash. Neurological:   Negative for headaches, focal weakness or numbness. Psychiatric:  No anxiety or depression.   Endocrine:  No hot/cold intolerance, changes in energy, or sleep difficulty.  10-point ROS otherwise negative.  ____________________________________________   PHYSICAL EXAM:  VITAL SIGNS: ED Triage Vitals  Enc Vitals Group     BP 01/18/15 0940 158/114 mmHg     Pulse Rate 01/18/15 0940 80  Resp 01/18/15 0940 18     Temp 01/18/15 0940 98 F (36.7 C)     Temp Source 01/18/15 0940 Oral     SpO2 01/18/15 0940 100 %     Weight 01/18/15 0929 180 lb (81.647 kg)     Height 01/18/15 0929  (1.88 m)     Head Cir --      Peak Flow --      Pain Score 01/18/15 0929 8     Pain Loc --      Pain Edu? --      Excl. in GC? --      Constitutional:   Alert and oriented. Well appearing and in no distress. Eyes:   No scleral icterus. No conjunctival pallor. PERRL. EOMI ENT   Head:   Normocephalic and atraumatic.   Nose:   No congestion/rhinnorhea. No septal hematoma   Mouth/Throat:   MMM, no pharyngeal erythema. No peritonsillar mass. No uvula  shift.   Neck:   No stridor. No SubQ emphysema. No meningismus. Hematological/Lymphatic/Immunilogical:   No cervical lymphadenopathy. Cardiovascular:   RRR. Normal and symmetric distal pulses are present in all extremities. No murmurs, rubs, or gallops. Respiratory:   Normal respiratory effort without tachypnea nor retractions. Breath sounds are clear and equal bilaterally. No wheezes/rales/rhonchi. Gastrointestinal:   Soft and nontender. No distention. There is no CVA tenderness.  No rebound, rigidity, or guarding. Genitourinary:   deferred Musculoskeletal:   Soft tissue tenderness over the left trapezius and all about the left shoulder. Worsened with abduction of the left shoulder as well as rotation of the left shoulder joint. Neurologic:   Normal speech and language.  CN 2-10 normal. Motor grossly intact. No pronator drift.  Normal gait. No gross focal neurologic deficits are appreciated.  Skin:    Skin is warm, dry and intact. No rash noted.  No petechiae, purpura, or bullae. Psychiatric:   Mood and affect are normal. Speech and behavior are normal. Patient exhibits appropriate insight and judgment.  ____________________________________________    LABS (pertinent positives/negatives) (all labs ordered are listed, but only abnormal results are displayed) Labs Reviewed  BASIC METABOLIC PANEL - Abnormal; Notable for the following:    Glucose, Bld 103 (*)    All other components within normal limits  CBC WITH DIFFERENTIAL/PLATELET - Abnormal; Notable for the following:    RBC 4.39 (*)    All other components within normal limits  TROPONIN I   ____________________________________________   EKG  Interpreted by me  Date: 01/18/2015  Rate: 65  Rhythm: normal sinus rhythm  QRS Axis: normal  Intervals: normal  ST/T Wave abnormalities: normal  Conduction Disutrbances: none  Narrative Interpretation: unremarkable  There is minimal J-point elevation diffusely with preserved  concavity of the T-wave upslope, consistent with benign early repolarization    ____________________________________________    RADIOLOGY    ____________________________________________   PROCEDURES   ____________________________________________   INITIAL IMPRESSION / ASSESSMENT AND PLAN / ED COURSE  Pertinent labs & imaging results that were available during my care of the patient were reviewed by me and considered in my medical decision making (see chart for details).  Patient presents with musculoskeletal chest wall pain. Low suspicion for ACS PE TAD pneumothorax carditis mediastinitis pneumonia or sepsis. EKG is not consistent with pericarditis nor is his clinical assessment. We'll check labs due to the questionable findings of ST changes on EKG although very low suspicion that they are clinically significant. We'll give him NSAIDs in the meantime.  -----------------------------------------  11:25 AM on 01/18/2015 -----------------------------------------  Labs unremarkable. Patient stable, symptoms stable. We'll prescribe Valium and range of motion exercises and heat therapy at home. Follow-up with primary care     ____________________________________________   FINAL CLINICAL IMPRESSION(S) / ED DIAGNOSES  Final diagnoses:  Musculoskeletal chest pain      Sharman CheekPhillip Patric Vanpelt, MD 01/18/15 1125

## 2015-01-25 ENCOUNTER — Ambulatory Visit: Payer: Self-pay | Admitting: Family Medicine

## 2015-04-05 ENCOUNTER — Encounter: Payer: Self-pay | Admitting: Family Medicine

## 2015-04-05 ENCOUNTER — Ambulatory Visit (INDEPENDENT_AMBULATORY_CARE_PROVIDER_SITE_OTHER): Payer: 59 | Admitting: Family Medicine

## 2015-04-05 VITALS — BP 116/58 | HR 86 | Temp 98.6°F | Resp 16 | Wt 181.4 lb

## 2015-04-05 DIAGNOSIS — R1319 Other dysphagia: Secondary | ICD-10-CM | POA: Diagnosis not present

## 2015-04-05 DIAGNOSIS — I10 Essential (primary) hypertension: Secondary | ICD-10-CM

## 2015-04-05 MED ORDER — LISINOPRIL 10 MG PO TABS: 10.0000 mg | ORAL_TABLET | Freq: Every day | ORAL | Status: DC

## 2015-04-05 NOTE — Progress Notes (Signed)
Name: Jason Hale   MRN: [id number]    DOB: 1990/07/24   Date:04/05/2015       Progress Note  Subjective  Chief Complaint  Chief Complaint  Patient presents with  . Medication Refill  . Hypertension    HPI  Jason Hale is a 25 year old male here for follow up for HTN and refills of medications for the same. It has been some time since I last saw him in the clinic for follow up and there is reported noncompliance with anti-HTN therapy. He has seen in the ER around Dec 2016 for chest wall pain. Labs unremarkable including troponin which were obtained because there were some initial questionable ST changes on his initial EKG. He received a temporary supply of his Lisinopril 10 mg and he reports being back on this medication with no problems.  He previously also reported intermittent difficulty is swallowing especially tougher meats like pork and beef. The sensation of stuck food was upper pharynx without pain, choking, coughing. He has however changed his diet to more fruits and vegetables and less meat and symptoms have subsided some. No associated reflux or epigastric discomfort.   Past Medical History  Diagnosis Date  . Hypertension   . IBS (irritable bowel syndrome)     Patient Active Problem List   Diagnosis Date Noted  . Hypertension goal BP (blood pressure) < 140/90 04/05/2015    Social History  Substance Use Topics  . Smoking status: Former Research scientist (life sciences)  . Smokeless tobacco: Not on file  . Alcohol Use: No     Current outpatient prescriptions:  .  lisinopril (PRINIVIL,ZESTRIL) 10 MG tablet, Take 10 mg by mouth daily., Disp: , Rfl:   Past Surgical History  Procedure Laterality Date  . Appendectomy    . Tonsillectomy    . Esophagogastroduodenoscopy N/A 07/28/2014    Procedure: ESOPHAGOGASTRODUODENOSCOPY (EGD);  Surgeon: Teena Irani, MD;  Location: [facility] ENDOSCOPY;  Service: Endoscopy;  Laterality: N/A;  . Foreign body removal N/A 07/28/2014    Procedure: FOREIGN BODY REMOVAL;   Surgeon: Teena Irani, MD;  Location: [facility];  Service: Endoscopy;  Laterality: N/A;    Family History  Problem Relation Age of Onset  . Hypertension Mother   . Heart disease Maternal Grandmother     No Known Allergies   Review of Systems  CONSTITUTIONAL: No significant weight changes, fever, chills, weakness or fatigue.  HEENT:  - Eyes: No visual changes.  - Ears: No auditory changes. No pain.  - Nose: No sneezing, congestion, runny nose. - Throat: No sore throat. No changes in swallowing. SKIN: No rash or itching.  CARDIOVASCULAR: No chest pain, chest pressure or chest discomfort. No palpitations or edema.  RESPIRATORY: No shortness of breath, cough or sputum.  GASTROINTESTINAL: No anorexia, nausea, vomiting. No changes in bowel habits. No abdominal pain or blood.  GENITOURINARY: No dysuria. No frequency. No discharge.  NEUROLOGICAL: No headache, dizziness, syncope, paralysis, ataxia, numbness or tingling in the extremities. No memory changes. No change in bowel or bladder control.  MUSCULOSKELETAL: No joint pain. No muscle pain. HEMATOLOGIC: No anemia, bleeding or bruising.  LYMPHATICS: No enlarged lymph nodes.  PSYCHIATRIC: No change in mood. No change in sleep pattern.  ENDOCRINOLOGIC: No reports of sweating, cold or heat intolerance. No polyuria or polydipsia.     Objective  BP 116/58 mmHg  Pulse 86  Temp(Src) 98.6 F (37 C) (Oral)  Resp 16  Wt 181 lb 6.4 oz (82.283 kg)  SpO2 98% Body mass  index is 23.28 kg/(m^2).  Physical Exam  Constitutional: Patient appears well-developed and well-nourished. In no distress.  HEENT:  - Head: Normocephalic and atraumatic.  - Ears: Bilateral TMs gray, no erythema or effusion - Nose: Nasal mucosa moist - Mouth/Throat: Oropharynx is clear and moist. No tonsillar hypertrophy or erythema. No post nasal drainage.  - Eyes: Conjunctivae clear, EOM movements normal. PERRLA. No scleral icterus.  Neck: Normal range of motion.  Neck supple. No JVD present. No thyromegaly present.  Cardiovascular: Normal rate, regular rhythm and normal heart sounds.  No murmur heard.  Pulmonary/Chest: Effort normal and breath sounds normal. No respiratory distress. Musculoskeletal: Normal range of motion bilateral UE and LE, no joint effusions. Peripheral vascular: Bilateral LE no edema. Neurological: CN II-XII grossly intact with no focal deficits. Alert and oriented to person, place, and time. Coordination, balance, strength, speech and gait are normal.  Skin: Skin is warm and dry. No rash noted. No erythema.  Psychiatric: Patient has a normal mood and affect. Behavior is normal in office today. Judgment and thought content normal in office today.   Recent Results (from the past 2160 hour(s))  Basic metabolic panel     Status: Abnormal   Collection Time: 01/18/15  9:55 AM  Result Value Ref Range   Sodium 140 135 - 145 mmol/L   Potassium 3.6 3.5 - 5.1 mmol/L   Chloride 104 101 - 111 mmol/L   CO2 28 22 - 32 mmol/L   Glucose, Bld 103 (H) 65 - 99 mg/dL   BUN 17 6 - 20 mg/dL   Creatinine, Ser 1.01 0.61 - 1.24 mg/dL   Calcium 9.2 8.9 - 10.3 mg/dL   GFR calc non Af Amer >60 >60 mL/min   GFR calc Af Amer >60 >60 mL/min    Comment: (NOTE) The eGFR has been calculated using the CKD EPI equation. This calculation has not been validated in all clinical situations. eGFR's persistently <60 mL/min signify possible Chronic Kidney Disease.    Anion gap 8 5 - 15  CBC with Differential     Status: Abnormal   Collection Time: 01/18/15  9:55 AM  Result Value Ref Range   WBC 6.3 3.8 - 10.6 K/uL   RBC 4.39 (L) 4.40 - 5.90 MIL/uL   Hemoglobin 13.8 13.0 - 18.0 g/dL   HCT 42.5 40.0 - 52.0 %   MCV 96.9 80.0 - 100.0 fL   MCH 31.4 26.0 - 34.0 pg   MCHC 32.4 32.0 - 36.0 g/dL   RDW 13.9 11.5 - 14.5 %   Platelets 231 150 - 440 K/uL   Neutrophils Relative % 44 %   Neutro Abs 2.8 1.4 - 6.5 K/uL   Lymphocytes Relative 41 %   Lymphs Abs 2.6 1.0  - 3.6 K/uL   Monocytes Relative 8 %   Monocytes Absolute 0.5 0.2 - 1.0 K/uL   Eosinophils Relative 6 %   Eosinophils Absolute 0.4 0 - 0.7 K/uL   Basophils Relative 1 %   Basophils Absolute 0.1 0 - 0.1 K/uL  Troponin I     Status: None   Collection Time: 01/18/15  9:55 AM  Result Value Ref Range   Troponin I <0.03 <0.031 ng/mL    Comment:        NO INDICATION OF MYOCARDIAL INJURY.      Assessment & Plan  1. Hypertension goal BP (blood pressure) < 140/90 Well controled. The patient has been counseled on the importance of the proper and regular use of their  medication(s) so as to achieve treatment goals. Patient verbalizes understanding that nonadhering to recommended treatment plan can result in adverse events.  - lisinopril (PRINIVIL,ZESTRIL) 10 MG tablet; Take 1 tablet (10 mg total) by mouth daily.  Dispense: 90 tablet; Refill: 2  2. Intermittent dysphagia Off PPI and GI consultation he has declined for now which I am agreeable to as his symptoms have not worsened and are in fact a bit improved with diet changes.

## 2015-06-19 ENCOUNTER — Emergency Department (HOSPITAL_COMMUNITY)
Admission: EM | Admit: 2015-06-19 | Discharge: 2015-06-19 | Disposition: A | Discharge status: Home / Self Care | Payer: 59 | Attending: Emergency Medicine | Admitting: Emergency Medicine

## 2015-06-19 ENCOUNTER — Encounter (HOSPITAL_COMMUNITY): Payer: Self-pay | Admitting: Emergency Medicine

## 2015-06-19 DIAGNOSIS — I10 Essential (primary) hypertension: Secondary | ICD-10-CM | POA: Insufficient documentation

## 2015-06-19 DIAGNOSIS — R0981 Nasal congestion: Secondary | ICD-10-CM | POA: Diagnosis present

## 2015-06-19 DIAGNOSIS — Z79899 Other long term (current) drug therapy: Secondary | ICD-10-CM | POA: Diagnosis not present

## 2015-06-19 DIAGNOSIS — K589 Irritable bowel syndrome without diarrhea: Secondary | ICD-10-CM | POA: Diagnosis not present

## 2015-06-19 DIAGNOSIS — J302 Other seasonal allergic rhinitis: Secondary | ICD-10-CM | POA: Insufficient documentation

## 2015-06-19 DIAGNOSIS — Z87891 Personal history of nicotine dependence: Secondary | ICD-10-CM | POA: Insufficient documentation

## 2015-06-19 LAB — RAPID STREP SCREEN (MED CTR MEBANE ONLY): Streptococcus, Group A Screen (Direct): NEGATIVE

## 2015-06-19 NOTE — ED Notes (Signed)
Per pt, states nasal congestion, post nasal drip and sore throat for a few days

## 2015-06-19 NOTE — ED Provider Notes (Addendum)
CSN: [id number]     Arrival date & time 06/19/15  [id number] History   First MD Initiated Contact with Patient 06/19/15 1036     Chief Complaint  Patient presents with  . Nasal Congestion     (Consider location/radiation/quality/duration/timing/severity/associated sxs/prior Treatment) The history is provided by the patient.   Jason Hale is a 25 y.o. male who complains of nasal congestion, for several days. He also has postnasal drip and sore throat. He denies fever, chills, nausea or vomiting. There are no other known modifying factors.     Past Medical History  Diagnosis Date  . Hypertension   . IBS (irritable bowel syndrome)    Past Surgical History  Procedure Laterality Date  . Appendectomy    . Tonsillectomy    . Esophagogastroduodenoscopy N/A 07/28/2014    Procedure: ESOPHAGOGASTRODUODENOSCOPY (EGD);  Surgeon: Dorena CookeyJohn Hayes, MD;  [facility];  Service: Endoscopy;  Laterality: N/A;  . Foreign body removal N/A 07/28/2014    Procedure: FOREIGN BODY REMOVAL;  Surgeon: Dorena CookeyJohn Hayes, MD;  [facility];  Service: Endoscopy;  Laterality: N/A;   Family History  Problem Relation Age of Onset  . Hypertension Mother   . Heart disease Maternal Grandmother    Social History  Substance Use Topics  . Smoking status: Former Games developermoker  . Smokeless tobacco: None  . Alcohol Use: No    Review of Systems  All other systems reviewed and are negative.     Allergies  Review of patient's allergies indicates no known allergies.  Home Medications   Prior to Admission medications   Medication Sig Start Date End Date Taking? Authorizing Provider  lisinopril (PRINIVIL,ZESTRIL) 10 MG tablet Take 1 tablet (10 mg total) by mouth daily. 04/05/15  Yes Edwena FeltyAshany Sundaram, MD  Pheniramine-PE-APAP [facility](THERAFLU COLD & SORE THROAT) 20-10-325 MG PACK Take by mouth once as needed (sore throat.).    Yes Historical Provider, MD   BP 134/74 mmHg  Pulse 63  Temp(Src) 98.1 F (36.7 C) (Oral)   Resp 18  SpO2 98% Physical Exam  Constitutional: He is oriented to person, place, and time. He appears well-developed and well-nourished.  HENT:  Head: Normocephalic and atraumatic.  Right Ear: External ear normal.  Left Ear: External ear normal.  Eyes: Conjunctivae and EOM are normal. Pupils are equal, round, and reactive to light.  Neck: Normal range of motion and phonation normal. Neck supple.  Cardiovascular: Normal rate and regular rhythm.   Pulmonary/Chest: He is in respiratory distress. He exhibits no bony tenderness.  Abdominal: Soft.  Musculoskeletal: Normal range of motion.  Neurological: He is alert and oriented to person, place, and time. No cranial nerve deficit or sensory deficit. He exhibits normal muscle tone. Coordination normal.  Skin: Skin is warm, dry and intact. No rash noted. No erythema.  Psychiatric: He has a normal mood and affect. His behavior is normal. Judgment and thought content normal.  Nursing note and vitals reviewed.   ED Course  Procedures (including critical care time)  Medications - No data to display  Patient Vitals for the past 24 hrs:  BP Temp Temp src Pulse Resp SpO2  06/19/15 0959 134/74 mmHg 98.1 F (36.7 C) Oral 63 18 98 %    12:57 PM Reevaluation with update and discussion. After initial assessment and treatment, an updated evaluation reveals He remains comfortable. He was able to tolerate oral liquids without problems. Findings discussed with patient, all questions answered. [name] L     Labs Review Labs Reviewed  RAPID STREP SCREEN (NOT AT [facility])  CULTURE, GROUP A STREP [facility])    Imaging Review No results found. I have personally reviewed and evaluated these images and lab results as part of my medical decision-making.   EKG Interpretation None      MDM   Final diagnoses:  Other seasonal allergic rhinitis    Symptoms most consistent with allergic rhinitis. Doubt sinusitis, pneumonia, esophageal, or  oropharyngeal obstruction.  Nursing Notes Reviewed/ Care Coordinated Applicable Imaging Reviewed Interpretation of Laboratory Data incorporated into ED treatment  The patient appears reasonably screened and/or stabilized for discharge and I doubt any other medical condition or other [facility] requiring further screening, evaluation, or treatment in the ED at this time prior to discharge.  Plan: Home Medications- Flonase or Claritin prn; Home Treatments- rest, fluids; return here if the recommended treatment, does not improve the symptoms; Recommended follow up- PCP prn  [name], MD 06/19/15 1259  [name], MD 08/27/15 1534

## 2015-06-19 NOTE — Discharge Instructions (Signed)
Use Flonase nasal spray, twice a day for 1 month, or Claritin, daily for allergy symptoms.     Allergic Rhinitis Allergic rhinitis is when the mucous membranes in the nose respond to allergens. Allergens are particles in the air that cause your body to have an allergic reaction. This causes you to release allergic antibodies. Through a chain of events, these eventually cause you to release histamine into the blood stream. Although meant to protect the body, it is this release of histamine that causes your discomfort, such as frequent sneezing, congestion, and an itchy, runny nose.  CAUSES Seasonal allergic rhinitis (hay fever) is caused by pollen allergens that may come from grasses, trees, and weeds. Year-round allergic rhinitis (perennial allergic rhinitis) is caused by allergens such as house dust mites, pet dander, and mold spores. SYMPTOMS  Nasal stuffiness (congestion).  Itchy, runny nose with sneezing and tearing of the eyes. DIAGNOSIS Your health care provider can help you determine the allergen or allergens that trigger your symptoms. If you and your health care provider are unable to determine the allergen, skin or blood testing may be used. Your health care provider will diagnose your condition after taking your health history and performing a physical exam. Your health care provider may assess you for other related conditions, such as asthma, pink eye, or an ear infection. TREATMENT Allergic rhinitis does not have a cure, but it can be controlled by:  Medicines that block allergy symptoms. These may include allergy shots, nasal sprays, and oral antihistamines.  Avoiding the allergen. Hay fever may often be treated with antihistamines in pill or nasal spray forms. Antihistamines block the effects of histamine. There are over-the-counter medicines that may help with nasal congestion and swelling around the eyes. Check with your health care provider before taking or giving this  medicine. If avoiding the allergen or the medicine prescribed do not work, there are many new medicines your health care provider can prescribe. Stronger medicine may be used if initial measures are ineffective. Desensitizing injections can be used if medicine and avoidance does not work. Desensitization is when a patient is given ongoing shots until the body becomes less sensitive to the allergen. Make sure you follow up with your health care provider if problems continue. HOME CARE INSTRUCTIONS It is not possible to completely avoid allergens, but you can reduce your symptoms by taking steps to limit your exposure to them. It helps to know exactly what you are allergic to so that you can avoid your specific triggers. SEEK MEDICAL CARE IF:  You have a fever.  You develop a cough that does not stop easily (persistent).  You have shortness of breath.  You start wheezing.  Symptoms interfere with normal daily activities.   This information is not intended to replace advice given to you by your health care provider. Make sure you discuss any questions you have with your health care provider.   Document Released: 10/17/2000 Document Revised: 02/12/2014 Document Reviewed: 09/29/2012 Elsevier Interactive Patient Education [employer] Elsevier Inc.

## 2015-06-19 NOTE — ED Notes (Signed)
Tolerated PO intake.  

## 2015-06-22 LAB — CULTURE, GROUP A STREP (THRC)

## 2015-10-06 ENCOUNTER — Ambulatory Visit: Payer: 59 | Admitting: Family Medicine

## 2015-10-23 ENCOUNTER — Emergency Department (HOSPITAL_COMMUNITY)
Admission: EM | Admit: 2015-10-23 | Discharge: 2015-10-23 | Disposition: A | Discharge status: Home / Self Care | Payer: 59 | Attending: Emergency Medicine | Admitting: Emergency Medicine

## 2015-10-23 ENCOUNTER — Encounter (HOSPITAL_COMMUNITY): Payer: Self-pay

## 2015-10-23 ENCOUNTER — Emergency Department (HOSPITAL_COMMUNITY): Payer: 59

## 2015-10-23 DIAGNOSIS — R0789 Other chest pain: Secondary | ICD-10-CM | POA: Insufficient documentation

## 2015-10-23 DIAGNOSIS — R079 Chest pain, unspecified: Secondary | ICD-10-CM | POA: Diagnosis not present

## 2015-10-23 DIAGNOSIS — Z87891 Personal history of nicotine dependence: Secondary | ICD-10-CM | POA: Insufficient documentation

## 2015-10-23 DIAGNOSIS — I10 Essential (primary) hypertension: Secondary | ICD-10-CM | POA: Insufficient documentation

## 2015-10-23 LAB — BASIC METABOLIC PANEL
ANION GAP: 5 (ref 5–15)
BUN: 17 mg/dL (ref 6–20)
CHLORIDE: 106 mmol/L (ref 101–111)
CO2: 27 mmol/L (ref 22–32)
Calcium: 10.1 mg/dL (ref 8.9–10.3)
Creatinine, Ser: 1.14 mg/dL (ref 0.61–1.24)
GFR calc non Af Amer: >60 mL/min (ref >60)
Glucose, Bld: 79 mg/dL (ref 65–99)
POTASSIUM: 3.8 mmol/L (ref 3.5–5.1)
SODIUM: 138 mmol/L (ref 135–145)

## 2015-10-23 LAB — I-STAT TROPONIN, ED
TROPONIN I, POC: 0.01 ng/mL (ref 0.00–0.08)
Troponin i, poc: 0 ng/mL (ref 0.00–0.08)

## 2015-10-23 LAB — CBC
HEMATOCRIT: 45.4 % (ref 39.0–52.0)
HEMOGLOBIN: 15 g/dL (ref 13.0–17.0)
MCH: 32.7 pg (ref 26.0–34.0)
MCHC: 33 g/dL (ref 30.0–36.0)
MCV: 98.9 fL (ref 78.0–100.0)
PLATELETS: 251 10*3/uL (ref 150–400)
RBC: 4.59 MIL/uL (ref 4.22–5.81)
RDW: 13.4 % (ref 11.5–15.5)
WBC: 11.7 10*3/uL — AB (ref 4.0–10.5)

## 2015-10-23 LAB — D-DIMER, QUANTITATIVE (NOT AT ARMC)

## 2015-10-23 NOTE — ED Provider Notes (Signed)
Chest pain x 1 month - sharp, intermittent, retrosternal Chest tightness bilaterally ?Exertional  [facility] Delta trop due at 10:00 pm EKG early repolarization per Rancour and cardiology who reviewed No pain now and no pain since arrival  CXR, labs unremarkable  Plan: delta trop negative, discharge to home with PCP follow up  Delta trop negative. Patient can be discharged home per plan of previous treatment team.    Elpidio AnisShari Sevan Mcbroom, PA-C 10/25/15 [id number]    Glynn OctaveStephen Rancour, MD 10/25/15 [phone]

## 2015-10-23 NOTE — ED Triage Notes (Signed)
Per EMS: Pt complaining of sharp substernal chest pain x 1 month. Pt complaining of increased pain today. Pt complaining of some SOB. Pt a/o x 4.

## 2015-10-23 NOTE — ED Provider Notes (Signed)
[facility]-EM[facility]ENCY DEPT Provider Note   CSN: [id number] Arrival date & time: 10/23/15  1841     History   Chief Complaint Chief Complaint  Patient presents with  . Chest Pain    HPI Reather LaurenceRakee L Lueras is a 25 y.o. male.  HPI   Reather LaurenceRakee L Labarre is a 25 y.o. male, with a history of HTN, presenting to the ED with intermittent chest tightness for the past month. States he was playing basketball about an hour ago when he began to have sharp retrosternal chest pain. Patient states the pain runs the length of his sternum, is moderate, nonradiating. Pain lasts a few seconds to a few minutes and spontaneously resolves. Patient also endorses nausea and diaphoresis that has since resolved. Denies current chest pain, shortness of breath, vomiting, fever/chills, cough, or any other complaints. Patient admits to smoking marijuana at least twice a day.   Past Medical History:  Diagnosis Date  . Hypertension   . IBS (irritable bowel syndrome)     Patient Active Problem List   Diagnosis Date Noted  . Hypertension goal BP (blood pressure) < 140/90 04/05/2015  . Intermittent dysphagia 04/05/2015    Past Surgical History:  Procedure Laterality Date  . APPENDECTOMY    . ESOPHAGOGASTRODUODENOSCOPY N/A 07/28/2014   Procedure: ESOPHAGOGASTRODUODENOSCOPY (EGD);  Surgeon: Dorena CookeyJohn Hayes, MD;  [facility];  Service: Endoscopy;  Laterality: N/A;  . FOREIGN BODY REMOVAL N/A 07/28/2014   Procedure: FOREIGN BODY REMOVAL;  Surgeon: Dorena CookeyJohn Hayes, MD;  [facility];  Service: Endoscopy;  Laterality: N/A;  . TONSILLECTOMY         Home Medications    Prior to Admission medications   Medication Sig Start Date End Date Taking? Authorizing Provider  lisinopril (PRINIVIL,ZESTRIL) 10 MG tablet Take 1 tablet (10 mg total) by mouth daily. 04/05/15   Edwena FeltyAshany Sundaram, MD  Pheniramine-PE-APAP [facility](THERAFLU COLD & SORE THROAT) 20-10-325 MG PACK Take by mouth once as needed (sore throat.).     Historical Provider,  MD    Family History Family History  Problem Relation Age of Onset  . Hypertension Mother   . Heart disease Maternal Grandmother     Social History Social History  Substance Use Topics  . Smoking status: Former Games developermoker  . Smokeless tobacco: Never Used  . Alcohol use No     Allergies   Review of patient's allergies indicates no known allergies.   Review of Systems Review of Systems  Constitutional: Negative for chills and fever.  Respiratory: Positive for chest tightness. Negative for cough and shortness of breath.   Cardiovascular: Positive for chest pain.  Gastrointestinal: Negative for abdominal pain, nausea and vomiting.  Neurological: Negative for dizziness and numbness.  All other systems reviewed and are negative.    Physical Exam Updated Vital Signs BP 129/83   Pulse 72   Resp 22   SpO2 99%   Physical Exam  Constitutional: He appears well-developed and well-nourished. No distress.  HENT:  Head: Normocephalic and atraumatic.  Eyes: Conjunctivae are normal.  Neck: Neck supple.  Cardiovascular: Normal rate, regular rhythm, normal heart sounds and intact distal pulses.   Pulmonary/Chest: Effort normal and breath sounds normal. No respiratory distress. He exhibits no tenderness.  Abdominal: Soft. There is no tenderness. There is no guarding.  Musculoskeletal: He exhibits no edema or tenderness.  Lymphadenopathy:    He has no cervical adenopathy.  Neurological: He is alert.  Skin: Skin is warm and dry. He is not diaphoretic.  Psychiatric: He has  a normal mood and affect. His behavior is normal.  Nursing note and vitals reviewed.    ED Treatments / Results  Labs (all labs ordered are listed, but only abnormal results are displayed) Labs Reviewed  CBC - Abnormal; Notable for the following:       Result Value   WBC 11.7 (*)    All other components within normal limits  BASIC METABOLIC PANEL  D-DIMER, QUANTITATIVE (NOT AT [facility])  Rosezena Sensor, ED      EKG  EKG Interpretation  Date/Time:  Sunday October 23 2015 18:54:33 EDT Ventricular Rate:  68 PR Interval:    QRS Duration: 85 QT Interval:  346 QTC Calculation: 368 R Axis:   80 Text Interpretation:  Sinus rhythm Consider left atrial enlargement ST elevation inferior and laterally similar to previous no reciprocal change d/w Dr. Eldridge Dace Confirmed by Manus Gunning  MD, STEPHEN [phone]) on 10/23/2015 7:42:15 PM       Radiology Dg Chest 2 View  Result Date: 10/23/2015 CLINICAL DATA:  Chronic sharp central chest pain and nausea. Initial encounter. EXAM: CHEST  2 VIEW COMPARISON:  None. FINDINGS: The lungs are well-aerated and clear. There is no evidence of focal opacification, pleural effusion or pneumothorax. The heart is normal in size; the mediastinal contour is within normal limits. No acute osseous abnormalities are seen. IMPRESSION: No acute cardiopulmonary process seen. Electronically Signed   By: Roanna Raider M.D.   On: 10/23/2015 19:59    Procedures Procedures (including critical care time)  Medications Ordered in ED Medications - No data to display   Initial Impression / Assessment and Plan / ED Course  I have reviewed the triage vital signs and the nursing notes.  Pertinent labs & imaging results that were available during my care of the patient were reviewed by me and considered in my medical decision making (see chart for details).  Clinical Course    Patient presents with intermittent chest pain for the past month.  Findings and plan of care discussed with Glynn Octave, MD. Dr. Manus Gunning personally evaluated and examined this patient.  EKG similar to previous. Early repolarization inferior and lateral. No reciprocal changes. Low suspicion for ACS. HEART score is 1, indicating low risk for a cardiac event. Wells criteria score is 0, indicating low risk for PE.    End of shift patient care handoff report given to Elpidio Anis, PA-C. Plan: Delta troponin.  D-dimer. Discharge.  Final Clinical Impressions(s) / ED Diagnoses   Final diagnoses:  Chest pain, unspecified chest pain type    New Prescriptions New Prescriptions   No medications on file     Concepcion Living 10/23/15 2057    Glynn Octave, MD 10/24/15 [phone]

## 2015-10-28 ENCOUNTER — Ambulatory Visit: Payer: 59 | Admitting: Family Medicine

## 2015-11-01 ENCOUNTER — Ambulatory Visit (INDEPENDENT_AMBULATORY_CARE_PROVIDER_SITE_OTHER): Payer: 59 | Admitting: Family Medicine

## 2015-11-01 ENCOUNTER — Encounter: Payer: Self-pay | Admitting: Family Medicine

## 2015-11-01 DIAGNOSIS — I10 Essential (primary) hypertension: Secondary | ICD-10-CM | POA: Diagnosis not present

## 2015-11-01 DIAGNOSIS — J9801 Acute bronchospasm: Secondary | ICD-10-CM | POA: Diagnosis not present

## 2015-11-01 DIAGNOSIS — R1319 Other dysphagia: Secondary | ICD-10-CM

## 2015-11-01 MED ORDER — RANITIDINE HCL 150 MG/10ML PO SYRP: 150.0000 mg | ORAL_SOLUTION | Freq: Two times a day (BID) | ORAL | 2 refills | Status: DC

## 2015-11-01 MED ORDER — ALBUTEROL SULFATE HFA 108 (90 BASE) MCG/ACT IN AERS: 2.0000 | INHALATION_SPRAY | RESPIRATORY_TRACT | 1 refills | Status: DC | PRN

## 2015-11-01 NOTE — Patient Instructions (Addendum)
Try the DASH guidelines to help control blood pressure naturally  We'll have you see the gastroenterologist Try to avoid foods that trigger your symptoms Start the new Zantac (ranitidine) liquid Try the inhaler if needed for cough or tightness Call if not getting better  Dysphagia Swallowing problems (dysphagia) occur when solids and liquids seem to stick in your throat on the way down to your stomach, or the food takes longer to get to the stomach. Other symptoms include regurgitating food, noises coming from the throat, chest discomfort with swallowing, and a feeling of fullness or the feeling of something being stuck in your throat when swallowing. When blockage in your throat is complete, it may be associated with drooling. CAUSES  Problems with swallowing may occur because of problems with the muscles. The food cannot be propelled in the usual manner into your stomach. You may have ulcers, scar tissue, or inflammation in the tube down which food travels from your mouth to your stomach (esophagus), which blocks food from passing normally into the stomach. Causes of inflammation include:  Acid reflux from your stomach into your esophagus.  Infection.  Radiation treatment for cancer.  Medicines taken without enough fluids to wash them down into your stomach. You may have nerve problems that prevent signals from being sent to the muscles of your esophagus to contract and move your food down to your stomach. Globus pharyngeus is a relatively common problem in which there is a sense of an obstruction or difficulty in swallowing, without any physical abnormalities of the swallowing passages being found. This problem usually improves over time with reassurance and testing to rule out other causes. DIAGNOSIS Dysphagia can be diagnosed and its cause can be determined by tests in which you swallow a white substance that helps illuminate the inside of your throat (contrast medium) while X-rays are  taken. Sometimes a flexible telescope that is inserted down your throat (endoscopy) to look at your esophagus and stomach is used. TREATMENT   If the dysphagia is caused by acid reflux or infection, medicines may be used.  If the dysphagia is caused by problems with your swallowing muscles, swallowing therapy may be used to help you strengthen your swallowing muscles.  If the dysphagia is caused by a blockage or mass, procedures to remove the blockage may be done. HOME CARE INSTRUCTIONS  Try to eat soft food that is easier to swallow and check your weight on a daily basis to be sure that it is not decreasing.  Be sure to drink liquids when sitting upright (not lying down). SEEK MEDICAL CARE IF:  You are losing weight because you are unable to swallow.  You are coughing when you drink liquids (aspiration).  You are coughing up partially digested food. SEEK IMMEDIATE MEDICAL CARE IF:  You are unable to swallow your own saliva .  You are having shortness of breath or a fever, or both.  You have a hoarse voice along with difficulty swallowing. MAKE SURE YOU:  Understand these instructions.  Will watch your condition.  Will get help right away if you are not doing well or get worse.   This information is not intended to replace advice given to you by your health care provider. Make sure you discuss any questions you have with your health care provider.   Document Released: 01/20/2000 Document Revised: 02/12/2014 Document Reviewed: 07/11/2012 Elsevier Interactive Patient Education 2016 Elsevier Inc. DASH Eating Plan DASH stands for "Dietary Approaches to Stop Hypertension." The DASH eating plan  is a healthy eating plan that has been shown to reduce high blood pressure (hypertension). Additional health benefits may include reducing the risk of type 2 diabetes mellitus, heart disease, and stroke. The DASH eating plan may also help with weight loss. WHAT DO I NEED TO KNOW ABOUT THE  DASH EATING PLAN? For the DASH eating plan, you will follow these general guidelines:  Choose foods with a percent daily value for sodium of less than 5% (as listed on the food label).  Use salt-free seasonings or herbs instead of table salt or sea salt.  Check with your health care provider or pharmacist before using salt substitutes.  Eat lower-sodium products, often labeled as "lower sodium" or "no salt added."  Eat fresh foods.  Eat more vegetables, fruits, and low-fat dairy products.  Choose whole grains. Look for the word "whole" as the first word in the ingredient list.  Choose fish and skinless chicken or [place]key more often than red meat. Limit fish, poultry, and meat to 6 oz (170 g) each day.  Limit sweets, desserts, sugars, and sugary drinks.  Choose heart-healthy fats.  Limit cheese to 1 oz (28 g) per day.  Eat more home-cooked food and less restaurant, buffet, and fast food.  Limit fried foods.  Cook foods using methods other than frying.  Limit canned vegetables. If you do use them, rinse them well to decrease the sodium.  When eating at a restaurant, ask that your food be prepared with less salt, or no salt if possible. WHAT FOODS CAN I EAT? Seek help from a dietitian for individual calorie needs. Grains Whole grain or whole wheat bread. Brown rice. Whole grain or whole wheat pasta. Quinoa, bulgur, and whole grain cereals. Low-sodium cereals. Corn or whole wheat flour tortillas. Whole grain cornbread. Whole grain crackers. Low-sodium crackers. Vegetables Fresh or frozen vegetables (raw, steamed, roasted, or grilled). Low-sodium or reduced-sodium tomato and vegetable juices. Low-sodium or reduced-sodium tomato sauce and paste. Low-sodium or reduced-sodium canned vegetables.  Fruits All fresh, canned (in natural juice), or frozen fruits. Meat and Other Protein Products Ground beef (85% or leaner), grass-fed beef, or beef trimmed of fat. Skinless chicken or  [place]key. Ground chicken or [place]key. Pork trimmed of fat. All fish and seafood. Eggs. Dried beans, peas, or lentils. Unsalted nuts and seeds. Unsalted canned beans. Dairy Low-fat dairy products, such as skim or 1% milk, 2% or reduced-fat cheeses, low-fat ricotta or cottage cheese, or plain low-fat yogurt. Low-sodium or reduced-sodium cheeses. Fats and Oils Tub margarines without trans fats. Light or reduced-fat mayonnaise and salad dressings (reduced sodium). Avocado. Safflower, olive, or canola oils. Natural peanut or almond butter. Other Unsalted popcorn and pretzels. The items listed above may not be a complete list of recommended foods or beverages. Contact your dietitian for more options. WHAT FOODS ARE NOT RECOMMENDED? Grains White bread. White pasta. White rice. Refined cornbread. Bagels and croissants. Crackers that contain trans fat. Vegetables Creamed or fried vegetables. Vegetables in a cheese sauce. Regular canned vegetables. Regular canned tomato sauce and paste. Regular tomato and vegetable juices. Fruits Dried fruits. Canned fruit in light or heavy syrup. Fruit juice. Meat and Other Protein Products Fatty cuts of meat. Ribs, chicken wings, bacon, sausage, bologna, salami, chitterlings, fatback, hot dogs, bratwurst, and packaged luncheon meats. Salted nuts and seeds. Canned beans with salt. Dairy Whole or 2% milk, cream, half-and-half, and cream cheese. Whole-fat or sweetened yogurt. Full-fat cheeses or blue cheese. Nondairy creamers and whipped toppings. Processed cheese, cheese spreads, or  cheese curds. Condiments Onion and garlic salt, seasoned salt, table salt, and sea salt. Canned and packaged gravies. Worcestershire sauce. Tartar sauce. Barbecue sauce. Teriyaki sauce. Soy sauce, including reduced sodium. Steak sauce. Fish sauce. Oyster sauce. Cocktail sauce. Horseradish. Ketchup and mustard. Meat flavorings and tenderizers. Bouillon cubes. Hot sauce. Tabasco sauce. Marinades.  Taco seasonings. Relishes. Fats and Oils Butter, stick margarine, lard, shortening, ghee, and bacon fat. Coconut, palm kernel, or palm oils. Regular salad dressings. Other Pickles and olives. Salted popcorn and pretzels. The items listed above may not be a complete list of foods and beverages to avoid. Contact your dietitian for more information. WHERE CAN I FIND MORE INFORMATION? National Heart, Lung, and Blood Institute: [employer]   This information is not intended to replace advice given to you by your health care provider. Make sure you discuss any questions you have with your health care provider.   Document Released: 01/11/2011 Document Revised: 02/12/2014 Document Reviewed: 11/26/2012 Elsevier Interactive Patient Education [employer]

## 2015-11-01 NOTE — Assessment & Plan Note (Signed)
Refer to GI for possible EGD; avoid triggers; start Zantac liquid

## 2015-11-01 NOTE — Assessment & Plan Note (Signed)
Try DASH guidelines 

## 2015-11-01 NOTE — Progress Notes (Signed)
BP 116/74   Pulse 82   Temp 98.3 F (36.8 C) (Oral)   Resp 14   Wt 182 lb (82.6 kg)   SpO2 96%   BMI 23.37 kg/m    Subjective:    Patient ID: Jason Hale, male    DOB: 1990/10/10, 25 y.o.   MRN: [id number]  HPI: Jason Hale is a 25 y.o. male  Chief Complaint  Patient presents with  . Chest Pain    SOB, chest pain   Patient is new to me today; his last visit to the clinic was 01/22/2014 with Dr. Sherley Bounds; his previous provider left our practice  He went to the ER on Sept 17, 2017, ER note reviewed Troponin I negative x 2 D-dimer negative CBC showed mildly elevated WBC, 11.7; no anemia; H/H were 15.0/45.5 Glucose 79, creatinine 1.14, K+ 3.8 Chest xray did not show any cardiopulmonary process Sept 17, 2017  [place]; chest pain over the left breast Grandfather died too When he breathes, he has to take a deeper breath; gradually; can feel a little pain He does have a lot of mucous that he does cough up at times; clear mucous; no fevers; some wheezes Did have asthma Some allergies Does not use inhaler now, but used to  He asked about his esophagus; he was told it was tight and needs to get it fixed; foods gets stuck; anything spicy feels like it locks it up; steaks and chicken and rice sticks; he has to vomit to get it unstuck  HTN; taking BP medicine; well-controlled; on medicine  Depression screen [facility] 2/9 11/01/2015 04/05/2015  Decreased Interest 0 0  Down, Depressed, Hopeless 0 1  PHQ - 2 Score 0 1   Relevant past medical, surgical, family and social history reviewed Past Medical History:  Diagnosis Date  . Hypertension   . IBS (irritable bowel syndrome)    Past Surgical History:  Procedure Laterality Date  . APPENDECTOMY    . ESOPHAGOGASTRODUODENOSCOPY N/A 07/28/2014   Procedure: ESOPHAGOGASTRODUODENOSCOPY (EGD);  Surgeon: Dorena Cookey, MD;  Location: [facility] ENDOSCOPY;  Service: Endoscopy;  Laterality: N/A;  . FOREIGN BODY REMOVAL  N/A 07/28/2014   Procedure: FOREIGN BODY REMOVAL;  Surgeon: Dorena Cookey, MD;  Location: [facility] ENDOSCOPY;  Service: Endoscopy;  Laterality: N/A;  . TONSILLECTOMY     Family History  Problem Relation Age of Onset  . Hypertension Mother   . Heart disease Maternal Grandmother    Social History  Substance Use Topics  . Smoking status: Former Games developer  . Smokeless tobacco: Never Used  . Alcohol use No   Interim medical history since last visit reviewed. Allergies and medications reviewed  Review of Systems Per HPI unless specifically indicated above     Objective:    BP 116/74   Pulse 82   Temp 98.3 F (36.8 C) (Oral)   Resp 14   Wt 182 lb (82.6 kg)   SpO2 96%   BMI 23.37 kg/m   Wt Readings from Last 3 Encounters:  11/01/15 182 lb (82.6 kg)  04/05/15 181 lb 6.4 oz (82.3 kg)  01/18/15 180 lb (81.6 kg)    Physical Exam  Constitutional: He appears well-developed and well-nourished. No distress.  HENT:  Head: Normocephalic and atraumatic.  Eyes: EOM are normal. No scleral icterus.  Neck: No thyromegaly present.  Cardiovascular: Normal rate and regular rhythm.   Pulmonary/Chest: Effort normal and breath sounds normal.  Abdominal: He exhibits no distension.  Musculoskeletal: He exhibits no edema.  Lymphadenopathy:    He has no cervical adenopathy.  Neurological: He is alert.  Skin: Skin is warm and dry. No pallor.  Psychiatric: He has a normal mood and affect. His behavior is normal. Judgment and thought content normal.   Results for orders placed or performed during the hospital encounter of 10/23/15  Basic metabolic panel  Result Value Ref Range   Sodium 138 135 - 145 mmol/L   Potassium 3.8 3.5 - 5.1 mmol/L   Chloride 106 101 - 111 mmol/L   CO2 27 22 - 32 mmol/L   Glucose, Bld 79 65 - 99 mg/dL   BUN 17 6 - 20 mg/dL   Creatinine, Ser 1.61 0.61 - 1.24 mg/dL   Calcium 09.6 8.9 - 04.5 mg/dL   GFR calc non Af Amer >60 >60 mL/min   GFR calc Af Amer >60 >60 mL/min   Anion  gap 5 5 - 15  CBC  Result Value Ref Range   WBC 11.7 (H) 4.0 - 10.5 K/uL   RBC 4.59 4.22 - 5.81 MIL/uL   Hemoglobin 15.0 13.0 - 17.0 g/dL   HCT 40.9 81.1 - 91.4 %   MCV 98.9 78.0 - 100.0 fL   MCH 32.7 26.0 - 34.0 pg   MCHC 33.0 30.0 - 36.0 g/dL   RDW 78.2 95.6 - 21.3 %   Platelets 251 150 - 400 K/uL  D-dimer, quantitative (not at [facility])  Result Value Ref Range   D-Dimer, Quant <0.27 0.00 - 0.50 ug/mL-FEU  I-stat troponin, ED  Result Value Ref Range   Troponin i, poc 0.00 0.00 - 0.08 ng/mL   Comment 3          I-stat troponin, ED  Result Value Ref Range   Troponin i, poc 0.01 0.00 - 0.08 ng/mL   Comment 3              Assessment & Plan:   Problem List Items Addressed This Visit      Cardiovascular and Mediastinum   Hypertension goal BP (blood pressure) < 140/90    Try DASH guidelines        Respiratory   Bronchospasm    Trial of SABA, call me if not getting better        Digestive   Intermittent dysphagia    Refer to GI for possible EGD; avoid triggers; start Zantac liquid      Relevant Medications   ranitidine (ZANTAC) 150 MG/10ML syrup   Other Relevant Orders   Ambulatory referral to Gastroenterology    Other Visit Diagnoses   None.     Follow up plan: Return if symptoms worsen or fail to improve.  An after-visit summary was printed and given to the patient at check-out.  Please see the patient instructions which may contain other information and recommendations beyond what is mentioned above in the assessment and plan.  Meds ordered this encounter  Medications  . ranitidine (ZANTAC) 150 MG/10ML syrup    Sig: Take 10 mLs (150 mg total) by mouth 2 (two) times daily. If needed    Dispense:  300 mL    Refill:  2  . albuterol (PROVENTIL HFA;VENTOLIN HFA) 108 (90 Base) MCG/ACT inhaler    Sig: Inhale 2 puffs into the lungs every 4 (four) hours as needed for wheezing or shortness of breath.    Dispense:  1 Inhaler    Refill:  1    Orders Placed This  Encounter  Procedures  .  Ambulatory referral to Gastroenterology

## 2015-11-01 NOTE — Assessment & Plan Note (Signed)
Trial of SABA, call me if not getting better

## 2015-12-02 ENCOUNTER — Encounter: Payer: Self-pay | Admitting: Family Medicine

## 2016-01-04 ENCOUNTER — Emergency Department
Admission: EM | Admit: 2016-01-04 | Discharge: 2016-01-04 | Disposition: A | Discharge status: Home / Self Care | Payer: 59 | Attending: Emergency Medicine | Admitting: Emergency Medicine

## 2016-01-04 ENCOUNTER — Emergency Department: Payer: 59

## 2016-01-04 DIAGNOSIS — R079 Chest pain, unspecified: Secondary | ICD-10-CM | POA: Diagnosis not present

## 2016-01-04 DIAGNOSIS — Z87891 Personal history of nicotine dependence: Secondary | ICD-10-CM | POA: Insufficient documentation

## 2016-01-04 DIAGNOSIS — Z79899 Other long term (current) drug therapy: Secondary | ICD-10-CM | POA: Diagnosis not present

## 2016-01-04 DIAGNOSIS — I10 Essential (primary) hypertension: Secondary | ICD-10-CM | POA: Insufficient documentation

## 2016-01-04 DIAGNOSIS — R0789 Other chest pain: Secondary | ICD-10-CM | POA: Diagnosis not present

## 2016-01-04 LAB — COMPREHENSIVE METABOLIC PANEL
ALBUMIN: 4.3 g/dL (ref 3.5–5.0)
ALK PHOS: 45 U/L (ref 38–126)
ALT: 14 U/L — AB (ref 17–63)
ANION GAP: 6 (ref 5–15)
AST: 22 U/L (ref 15–41)
BUN: 11 mg/dL (ref 6–20)
CALCIUM: 9.5 mg/dL (ref 8.9–10.3)
CO2: 27 mmol/L (ref 22–32)
Chloride: 105 mmol/L (ref 101–111)
Creatinine, Ser: 0.95 mg/dL (ref 0.61–1.24)
GFR calc Af Amer: >60 mL/min (ref >60)
GFR calc non Af Amer: >60 mL/min (ref >60)
GLUCOSE: 89 mg/dL (ref 65–99)
Potassium: 3.7 mmol/L (ref 3.5–5.1)
SODIUM: 138 mmol/L (ref 135–145)
Total Bilirubin: 1.1 mg/dL (ref 0.3–1.2)
Total Protein: 7.8 g/dL (ref 6.5–8.1)

## 2016-01-04 LAB — CBC
HCT: 43.6 % (ref 40.0–52.0)
HEMOGLOBIN: 14.8 g/dL (ref 13.0–18.0)
MCH: 32.9 pg (ref 26.0–34.0)
MCHC: 34 g/dL (ref 32.0–36.0)
MCV: 96.9 fL (ref 80.0–100.0)
Platelets: 231 10*3/uL (ref 150–440)
RBC: 4.5 MIL/uL (ref 4.40–5.90)
RDW: 13.7 % (ref 11.5–14.5)
WBC: 5.3 10*3/uL (ref 3.8–10.6)

## 2016-01-04 LAB — TROPONIN I
Troponin I: <0.03 ng/mL (ref <0.03)
Troponin I: <0.03 ng/mL (ref <0.03)

## 2016-01-04 MED ORDER — PANTOPRAZOLE SODIUM 40 MG PO TBEC
40.0000 mg | DELAYED_RELEASE_TABLET | Freq: Once | ORAL | Status: AC
  Administered 2016-01-04: 40 mg via ORAL
  Filled 2016-01-04: qty 1

## 2016-01-04 MED ORDER — ESOMEPRAZOLE MAGNESIUM 40 MG PO CPDR: 40.0000 mg | DELAYED_RELEASE_CAPSULE | Freq: Every day | ORAL | 0 refills | Status: DC

## 2016-01-04 NOTE — Discharge Instructions (Signed)
Take nexium daily.   See Dr. Welton FlakesKhan for follow up. You may need a stress test if you still have symptoms.   See your doctor.   Return to ER if you have worse chest pain, shortness of breath.

## 2016-01-04 NOTE — ED Provider Notes (Signed)
[facility]-EMERGENCY DEPARTMENT Provider Note   CSN: [id number] Arrival date & time: 01/04/16  1625     History   Chief Complaint Chief Complaint  Patient presents with  . Chest Pain    HPI Jason Hale is a 25 y.o. male hx of HTN, Who presenting with chest pain. Patient has intermittent left-sided chest pain has been going on for the last several months. Today he was driving, and had sudden onset of left-sided chest pain radiating down the left arm. Initially was 7 out of 10. Patient has been seen about 2 months ago in the ED and had negative d-dimer as well as Delta troponin. Patient also saw PCP and was thought to have esophageal spasms and given zantac, which helped minimally. He was referred to GI but hasn't seen them. His grandfather had MI but he has no personal hx of MI or CAD. No previous stress test.       The history is provided by the patient.    Past Medical History:  Diagnosis Date  . Hypertension   . IBS (irritable bowel syndrome)     Patient Active Problem List   Diagnosis Date Noted  . Bronchospasm 11/01/2015  . Hypertension goal BP (blood pressure) < 140/90 04/05/2015  . Intermittent dysphagia 04/05/2015    Past Surgical History:  Procedure Laterality Date  . APPENDECTOMY    . ESOPHAGOGASTRODUODENOSCOPY N/A 07/28/2014   Procedure: ESOPHAGOGASTRODUODENOSCOPY (EGD);  Surgeon: Dorena CookeyJohn Hayes, MD;  [facility];  Service: Endoscopy;  Laterality: N/A;  . FOREIGN BODY REMOVAL N/A 07/28/2014   Procedure: FOREIGN BODY REMOVAL;  Surgeon: Dorena CookeyJohn Hayes, MD;  [facility];  Service: Endoscopy;  Laterality: N/A;  . TONSILLECTOMY         Home Medications    Prior to Admission medications   Medication Sig Start Date End Date Taking? Authorizing Provider  albuterol (PROVENTIL HFA;VENTOLIN HFA) 108 (90 Base) MCG/ACT inhaler Inhale 2 puffs into the lungs every 4 (four) hours as needed for wheezing or shortness of breath. 11/01/15  Yes Kerman PasseyMelinda P Lada, MD    lisinopril (PRINIVIL,ZESTRIL) 10 MG tablet Take 1 tablet (10 mg total) by mouth daily. Patient not taking: Reported on 01/04/2016 04/05/15   Edwena FeltyAshany Sundaram, MD  ranitidine (ZANTAC) 150 MG/10ML syrup Take 10 mLs (150 mg total) by mouth 2 (two) times daily. If needed Patient not taking: Reported on 01/04/2016 11/01/15   Kerman PasseyMelinda P Lada, MD    Family History Family History  Problem Relation Age of Onset  . Hypertension Mother   . Heart disease Maternal Grandmother     Social History Social History  Substance Use Topics  . Smoking status: Former Games developermoker  . Smokeless tobacco: Never Used  . Alcohol use No     Allergies   Patient has no known allergies.   Review of Systems Review of Systems  Cardiovascular: Positive for chest pain.  All other systems reviewed and are negative.    Physical Exam Updated Vital Signs BP (!) 147/99   Pulse 67   Temp 98.1 F (36.7 C) (Oral)   Resp 17   Ht 6\' 2"  (1.88 m)   Wt 180 lb (81.6 kg)   SpO2 99%   BMI 23.11 kg/m   Physical Exam  Constitutional: He is oriented to person, place, and time. He appears well-developed and well-nourished.  Slightly anxious   HENT:  Head: Normocephalic.  Mouth/Throat: Oropharynx is clear and moist.  Eyes: EOM are normal. Pupils are equal, round, and reactive to  light.  Neck: Normal range of motion. Neck supple.  Cardiovascular: Normal rate, regular rhythm and normal heart sounds.   Pulmonary/Chest: Effort normal and breath sounds normal. No respiratory distress. He has no wheezes. He has no rales.  No reproducible tenderness   Abdominal: Soft. Bowel sounds are normal. He exhibits no distension. There is no tenderness. There is no guarding.  Musculoskeletal: Normal range of motion.  Neurological: He is alert and oriented to person, place, and time. No cranial nerve deficit. Coordination normal.  Skin: Skin is warm.  Psychiatric: He has a normal mood and affect.  Nursing note and vitals  reviewed.    ED Treatments / Results  Labs (all labs ordered are listed, but only abnormal results are displayed) Labs Reviewed  COMPREHENSIVE METABOLIC PANEL - Abnormal; Notable for the following:       Result Value   ALT 14 (*)    All other components within normal limits  CBC  TROPONIN I  TROPONIN I    EKG  EKG Interpretation None      ED ECG REPORT I, Richardean Canalavid H Yao, the attending physician, personally viewed and interpreted this ECG.   Date: 01/04/2016  EKG Time: 16:37 pm  Rate: 79  Rhythm: normal EKG, normal sinus rhythm  Axis: normal  Intervals:normal  ST&T Change: J point   ED ECG REPORT I, Richardean Canalavid H Yao, the attending physician, personally viewed and interpreted this ECG.   Date: 01/04/2016  EKG Time: 20:22 pm  Rate: 51  Rhythm: normal EKG, normal sinus rhythm  Axis: normal  Intervals:none  ST&T Change: J point, unchanged since previous     Radiology Dg Chest 2 View  Result Date: 01/04/2016 CLINICAL DATA:  Substernal chest pain. EXAM: CHEST  2 VIEW COMPARISON:  10/23/2015 FINDINGS: The cardiomediastinal silhouette is within normal limits. The lungs are well inflated and clear. There is no evidence of pleural effusion or pneumothorax. No acute osseous abnormality is identified. IMPRESSION: No active cardiopulmonary disease. Electronically Signed   By: Sebastian AcheAllen  Grady M.D.   On: 01/04/2016 18:01    Procedures Procedures (including critical care time)  Medications Ordered in ED Medications  pantoprazole (PROTONIX) EC tablet 40 mg (40 mg Oral Given 01/04/16 1937)     Initial Impression / Assessment and Plan / ED Course  I have reviewed the triage vital signs and the nursing notes.  Pertinent labs & imaging results that were available during my care of the patient were reviewed by me and considered in my medical decision making (see chart for details).  Clinical Course     Jason LaurenceRakee L Rolph is a 25 y.o. male here with chest pain. Consider reflux vs MSK  vs ACS. I doubt PE or dissection. Will get delta trop. Will give protonix.   8:20 PM Pain free now. Delta trop neg. Repeat EKG unremarkable. He had food impaction before and was noted to have some esophageal spasms on endoscopy. Will start nexium daily. Will refer to cardiology for possible stress test given family hx.    Final Clinical Impressions(s) / ED Diagnoses   Final diagnoses:  None    New Prescriptions New Prescriptions   No medications on file     Charlynne Panderavid Hsienta Yao, MD 01/04/16 2024

## 2016-01-04 NOTE — ED Triage Notes (Signed)
Pt arrives today with reports of midsternal CP  7/10 pain reported in triage

## 2016-05-01 ENCOUNTER — Ambulatory Visit: Payer: 59 | Admitting: Family Medicine

## 2016-05-02 ENCOUNTER — Ambulatory Visit (INDEPENDENT_AMBULATORY_CARE_PROVIDER_SITE_OTHER): Payer: 59 | Admitting: Family Medicine

## 2016-05-02 ENCOUNTER — Encounter: Payer: Self-pay | Admitting: Family Medicine

## 2016-05-02 VITALS — BP 128/84 | HR 67 | Temp 97.6°F | Resp 14 | Wt 168.3 lb

## 2016-05-02 DIAGNOSIS — Z114 Encounter for screening for human immunodeficiency virus [HIV]: Secondary | ICD-10-CM | POA: Diagnosis not present

## 2016-05-02 DIAGNOSIS — R1013 Epigastric pain: Secondary | ICD-10-CM | POA: Diagnosis not present

## 2016-05-02 DIAGNOSIS — Z113 Encounter for screening for infections with a predominantly sexual mode of transmission: Secondary | ICD-10-CM | POA: Diagnosis not present

## 2016-05-02 NOTE — Progress Notes (Signed)
BP 128/84   Pulse 67   Temp 97.6 F (36.4 C) (Oral)   Resp 14   Wt 168 lb 4.8 oz (76.3 kg)   SpO2 97%   BMI 21.61 kg/m    Subjective:    Patient ID: Jason Hale, male    DOB: November 06, 1990, 26 y.o.   MRN: [id number]  HPI: Jason Hale is a 26 y.o. male  Chief Complaint  Patient presents with  . Exposure to STD    wants to be tested   Patient is here to get tested for STIs No symptoms Previous episode of gc and chl, treated Sometimes uses protection (condom), but not always No questions about how to protect himself or partner No fevers No burning with urination  HTN; quit taking pill months ago; just eating better; less processed food  Has enough SABA; uses occasionally, less than 2x a week  GERD occasionally; will leave H2 blocker  Depression screen [facility] 2/9 05/02/2016 11/01/2015 04/05/2015  Decreased Interest 0 0 0  Down, Depressed, Hopeless 1 0 1  PHQ - 2 Score 1 0 1   Relevant past medical, surgical, family and social history reviewed Past Medical History:  Diagnosis Date  . Hypertension   . IBS (irritable bowel syndrome)    Past Surgical History:  Procedure Laterality Date  . APPENDECTOMY    . ESOPHAGOGASTRODUODENOSCOPY N/A 07/28/2014   Procedure: ESOPHAGOGASTRODUODENOSCOPY (EGD);  Surgeon: Dorena Cookey, MD;  Location: [facility] ENDOSCOPY;  Service: Endoscopy;  Laterality: N/A;  . FOREIGN BODY REMOVAL N/A 07/28/2014   Procedure: FOREIGN BODY REMOVAL;  Surgeon: Dorena Cookey, MD;  Location[facility] ENDOSCOPY;  Service: Endoscopy;  Laterality: N/A;  . TONSILLECTOMY     Family History  Problem Relation Age of Onset  . Hypertension Mother   . Diabetes Mother   . Asthma Son   . Heart disease Maternal Grandfather   . Heart attack Maternal Grandfather    Social History  Substance Use Topics  . Smoking status: Current Every Day Smoker  . Smokeless tobacco: Never Used  . Alcohol use No   Interim medical history since last visit reviewed. Allergies and medications  reviewed  Review of Systems Per HPI unless specifically indicated above     Objective:    BP 128/84   Pulse 67   Temp 97.6 F (36.4 C) (Oral)   Resp 14   Wt 168 lb 4.8 oz (76.3 kg)   SpO2 97%   BMI 21.61 kg/m   Wt Readings from Last 3 Encounters:  05/02/16 168 lb 4.8 oz (76.3 kg)  01/04/16 180 lb (81.6 kg)  11/01/15 182 lb (82.6 kg)    Physical Exam  Constitutional: He appears well-developed and well-nourished. No distress.  Eyes: No scleral icterus.  Cardiovascular: Normal rate.   Pulmonary/Chest: Effort normal.  Neurological: He is alert.  Psychiatric: He has a normal mood and affect.       Assessment & Plan:   Problem List Items Addressed This Visit      Other   Dyspepsia    May use H2 blocker PRN; avoid triggers       Other Visit Diagnoses    Screen for STD (sexually transmitted disease)    -  Primary   knowledge is power; testing ordered; encouraged safe sex practices; see AVS   Relevant Orders   Wet prep, genital   RPR (Completed)   Hepatitis, Acute (Completed)   Trichomonas vaginalis RNA, Ql,Males (Completed)   GC/Chlamydia Probe Amp (Completed)  Screening for HIV (human immunodeficiency virus)       Relevant Orders   HIV antibody (with reflex) (Completed)       Follow up plan: No Follow-up on file.  An after-visit summary was printed and given to the patient at check-out.  Please see the patient instructions which may contain other information and recommendations beyond what is mentioned above in the assessment and plan.  No orders of the defined types were placed in this encounter.   Orders Placed This Encounter  Procedures  . Wet prep, genital  . GC/Chlamydia Probe Amp  . HIV antibody (with reflex)  . RPR  . Hepatitis, Acute

## 2016-05-02 NOTE — Patient Instructions (Addendum)
We'll contact you about the labs  Preventing Sexually Transmitted Infections, Adult Sexually transmitted infections (STIs) are diseases that are passed (transmitted) from person to person through bodily fluids exchanged during sex or sexual contact. Bodily fluids include saliva, semen, blood, vaginal mucus, and urine. You may have an increased risk for developing an STI if you have unprotected oral, vaginal, or anal sex. Some common STIs include:  Herpes.  Hepatitis B.  Chlamydia.  Gonorrhea.  Syphilis.  HPV (human papillomavirus).  HIV (humanimmunodeficiency virus), the virus that can cause AIDS (acquired immunodeficiency virus). How can I protect myself from sexually transmitted infections? The only way to completely prevent STIs is not to have sex of any kind (practice abstinence). This includes oral, vaginal, or anal sex. If you are sexually active, take these actions to lower your risk of getting an STI:  Have only one sex partner (be monogamous) or limit the number of sexual partners you have.  Stay up-to-date on immunizations. Certain vaccines can lower your risk of getting certain STIs, such as:  Hepatitis A and B vaccines. You may have been vaccinated as a young child, but likely need a booster shot as a teen or young adult.  HPV vaccine. This vaccine is recommended if you are a man under age 23 or a woman under age 23.  Use methods that prevent the exchange of body fluids between partners (barrier protection) every time you have sex. Barrier protection can be used during oral, vaginal, or anal sex. Commonly used barrier methods include:  Male condom.  Male condom.  Dental dam.  Get tested regularly for STIs. Have your sexual partner get tested regularly as well.  Avoid mixing alcohol, drugs, and sex. Alcohol and drug use can affect your ability to make good decisions and can lead to risky sexual behaviors.  Ask your health care provider about taking pre-exposure  prophylaxis (PrEP) to prevent HIV infection if you:  Have a HIV-positive sexual partner.  Have multiple sexual partners or partners who do not know their HIV status, and do not regularly use a condom during sex.  Use injection drugs and share needles. Birth control pills, injections, implants, and intrauterine devices (IUDs) do not protect against STIs. To prevent both STIs and pregnancy, always use a condom with another form of birth control. Some STIs, such as herpes, are spread through skin to skin contact. A condom does not protect you from getting such STIs. If you or your partner have herpes and there is an active flare with open sores, avoid all sexual contact. Why are these changes important? Taking steps to practice safe sex protects you and others. Many STIs can be cured. However, some STIs are not curable and will affect you for the rest of your life. STIs can be passed on to another person even if you do not have symptoms. What can happen if changes are not made? Certain STIs may:  Require you to take medicine for the rest of your life.  Affect your ability to have children (your fertility).  Increase your risk for developing another STI or certain serious health conditions, such as:  Cervical cancer.  Head and neck cancer.  Pelvic inflammatory disease (PID) in women.  Organ damage or damage to other parts of your body, if the infection spreads.  Be passed to a baby during childbirth. How are sexually transmitted infections treated? If you or your partner know or think that you may have an STI:  Talk with your healthcare provider  about what can be done to treat it. Some STIs can be treated and cured with medicines.  For curable STIs, you and your partner should avoid sex during treatment and for several days after treatment is complete.  You and your partner should both be treated at the same time, if there is any chance that your partner is infected as well. If you  get treatment but your partner does not, your partner can re-infect you when you resume sexual contact.  Do not have unprotected sex. Where to find more information: Learn more about sexually transmitted diseases and infections from:  Centers for Disease Control and Prevention:  More information about specific STIs: SolutionApps.co.zawww.cdc.gov/std  Find places to get sexual health counseling and treatment for free or for a low cost: gettested.TonerPromos.nocdc.gov  U.S. Department of Health and Human Services: NotebookPreviews.siwww.womenshealth.gov/publications/our-publications/fact-sheet/sexually-transmitted-infections.html Summary  The only way to completely prevent STIs is not to have sex (practice abstinence), including oral, vaginal, or anal sex.  STIs can spread through saliva, semen, blood, vaginal mucus, urine, or sexual contact.  If you do have sex, limit your number of sexual partners and use a barrier protection method every time you have sex.  If you develop an STI, get treated right away and ask your partner to be treated as well. Do not resume having sex until both of you have completed treatment for the STI. This information is not intended to replace advice given to you by your health care provider. Make sure you discuss any questions you have with your health care provider. Document Released: 01/19/2016 Document Revised: 01/19/2016 Document Reviewed: 01/19/2016 Elsevier Interactive Patient Education  2017 [employer]

## 2016-05-03 LAB — RPR

## 2016-05-03 LAB — GC/CHLAMYDIA PROBE AMP
CT Probe RNA: NOT DETECTED
GC Probe RNA: NOT DETECTED

## 2016-05-03 LAB — HEPATITIS PANEL, ACUTE
HCV Ab: NEGATIVE
HEP B S AG: NEGATIVE
Hep A IgM: NONREACTIVE
Hep B C IgM: NONREACTIVE

## 2016-05-03 LAB — TRICHOMONAS VAGINALIS RNA, QL,MALES: Trichomonas vaginalis RNA: NOT DETECTED

## 2016-05-03 LAB — HIV ANTIBODY (ROUTINE TESTING W REFLEX): HIV: NONREACTIVE

## 2016-05-16 DIAGNOSIS — R1013 Epigastric pain: Secondary | ICD-10-CM | POA: Insufficient documentation

## 2016-05-16 NOTE — Assessment & Plan Note (Signed)
May use H2 blocker PRN; avoid triggers

## 2016-05-31 ENCOUNTER — Ambulatory Visit: Payer: 59 | Admitting: Family Medicine

## 2016-06-01 ENCOUNTER — Ambulatory Visit (INDEPENDENT_AMBULATORY_CARE_PROVIDER_SITE_OTHER): Payer: 59 | Admitting: Family Medicine

## 2016-06-01 ENCOUNTER — Encounter: Payer: Self-pay | Admitting: Family Medicine

## 2016-06-01 ENCOUNTER — Other Ambulatory Visit: Payer: Self-pay

## 2016-06-01 VITALS — BP 118/64 | HR 74 | Temp 97.8°F | Resp 14 | Wt 175.3 lb

## 2016-06-01 DIAGNOSIS — K648 Other hemorrhoids: Secondary | ICD-10-CM | POA: Diagnosis not present

## 2016-06-01 LAB — POC HEMOCCULT BLD/STL (OFFICE/1-CARD/DIAGNOSTIC)
Card #1 Date: 4272018
FECAL OCCULT BLD: NEGATIVE

## 2016-06-01 NOTE — Addendum Note (Signed)
Addended by: Kipp Laurence N on: 06/01/2016 09:40 AM   Modules accepted: Orders

## 2016-06-01 NOTE — Patient Instructions (Addendum)
Increase the fiber in your diet gradually Try to drink 64-72 ounces of water a day Keep stools soft and don't strain If your symptoms don't improve in 6-8 weeks, let me know   High-Fiber Diet Fiber, also called dietary fiber, is a type of carbohydrate found in fruits, vegetables, whole grains, and beans. A high-fiber diet can have many health benefits. Your health care provider may recommend a high-fiber diet to help:  Prevent constipation. Fiber can make your bowel movements more regular.  Lower your cholesterol.  Relieve hemorrhoids, uncomplicated diverticulosis, or irritable bowel syndrome.  Prevent overeating as part of a weight-loss plan.  Prevent heart disease, type 2 diabetes, and certain cancers. What is my plan? The recommended daily intake of fiber includes:  38 grams for men under age 54.  30 grams for men over age 55.  25 grams for women under age 31.  21 grams for women over age 54. You can get the recommended daily intake of dietary fiber by eating a variety of fruits, vegetables, grains, and beans. Your health care provider may also recommend a fiber supplement if it is not possible to get enough fiber through your diet. What do I need to know about a high-fiber diet?  Fiber supplements have not been widely studied for their effectiveness, so it is better to get fiber through food sources.  Always check the fiber content on thenutrition facts label of any prepackaged food. Look for foods that contain at least 5 grams of fiber per serving.  Ask your dietitian if you have questions about specific foods that are related to your condition, especially if those foods are not listed in the following section.  Increase your daily fiber consumption gradually. Increasing your intake of dietary fiber too quickly may cause bloating, cramping, or gas.  Drink plenty of water. Water helps you to digest fiber. What foods can I eat? Grains  Whole-grain breads. Multigrain  cereal. Oats and oatmeal. Brown rice. Barley. Bulgur wheat. Millet. Bran muffins. Popcorn. Rye wafer crackers. Vegetables  Sweet potatoes. Spinach. Kale. Artichokes. Cabbage. Broccoli. Green peas. Carrots. Squash. Fruits  Berries. Pears. Apples. Oranges. Avocados. Prunes and raisins. Dried figs. Meats and Other Protein Sources  Navy, kidney, pinto, and soy beans. Split peas. Lentils. Nuts and seeds. Dairy  Fiber-fortified yogurt. Beverages  Fiber-fortified soy milk. Fiber-fortified orange juice. Other  Fiber bars. The items listed above may not be a complete list of recommended foods or beverages. Contact your dietitian for more options.  What foods are not recommended? Grains  White bread. Pasta made with refined flour. White rice. Vegetables  Fried potatoes. Canned vegetables. Well-cooked vegetables. Fruits  Fruit juice. Cooked, strained fruit. Meats and Other Protein Sources  Fatty cuts of meat. Fried Environmental education officer or fried fish. Dairy  Milk. Yogurt. Cream cheese. Sour cream. Beverages  Soft drinks. Other  Cakes and pastries. Butter and oils. The items listed above may not be a complete list of foods and beverages to avoid. Contact your dietitian for more information.  What are some tips for including high-fiber foods in my diet?  Eat a wide variety of high-fiber foods.  Make sure that half of all grains consumed each day are whole grains.  Replace breads and cereals made from refined flour or white flour with whole-grain breads and cereals.  Replace white rice with brown rice, bulgur wheat, or millet.  Start the day with a breakfast that is high in fiber, such as a cereal that contains at least 5 grams  of fiber per serving.  Use beans in place of meat in soups, salads, or pasta.  Eat high-fiber snacks, such as berries, raw vegetables, nuts, or popcorn. This information is not intended to replace advice given to you by your health care provider. Make sure you discuss any  questions you have with your health care provider. Document Released: 01/22/2005 Document Revised: 06/30/2015 Document Reviewed: 07/07/2013 Elsevier Interactive Patient Education  2017 [employer].

## 2016-06-01 NOTE — Progress Notes (Signed)
BP 118/64   Pulse 74   Temp 97.8 F (36.6 C) (Oral)   Resp 14   Wt 175 lb 4.8 oz (79.5 kg)   SpO2 96%   BMI 22.51 kg/m    Subjective:    Patient ID: Jason Hale, male    DOB: 04/30/1990, 26 y.o.   MRN: [id number]  HPI: Jason Hale is a 26 y.o. male  Chief Complaint  Patient presents with  . Hemorrhoids    possible, wants it checked out states it feels like pulsating    HPI He has like a pulse in the rectum; happening for years Just periodically Not painful Just throbbing but not painful No bleeding Change in stool once in a while Soft and narrow sometimes No known fam hx of hemorrhoids No mucous Moving bowels regularly Not a good diet; doesn't think he gets hardly any fiber at all  Depression screen [facility] 2/9 06/01/2016 05/02/2016 11/01/2015 04/05/2015  Decreased Interest 0 0 0 0  Down, Depressed, Hopeless 0 1 0 1  PHQ - 2 Score 0 1 0 1   Relevant past medical, surgical, family and social history reviewed Past Medical History:  Diagnosis Date  . Hypertension   . IBS (irritable bowel syndrome)    Past Surgical History:  Procedure Laterality Date  . APPENDECTOMY    . ESOPHAGOGASTRODUODENOSCOPY N/A 07/28/2014   Procedure: ESOPHAGOGASTRODUODENOSCOPY (EGD);  Surgeon: Dorena Cookey, MD;  Location: [facility] ENDOSCOPY;  Service: Endoscopy;  Laterality: N/A;  . FOREIGN BODY REMOVAL N/A 07/28/2014   Procedure: FOREIGN BODY REMOVAL;  Surgeon: Dorena Cookey, MD;  Location: [facility] ENDOSCOPY;  Service: Endoscopy;  Laterality: N/A;  . TONSILLECTOMY     Family History  Problem Relation Age of Onset  . Hypertension Mother   . Diabetes Mother   . Asthma Son   . Heart disease Maternal Grandfather   . Heart attack Maternal Grandfather   no GI issues reported, no known hemorrhoids in family  Social History  Substance Use Topics  . Smoking status: Current Every Day Smoker  . Smokeless tobacco: Never Used  . Alcohol use No   Interim medical history since last visit reviewed. Allergies  and medications reviewed  Review of Systems Per HPI unless specifically indicated above     Objective:    BP 118/64   Pulse 74   Temp 97.8 F (36.6 C) (Oral)   Resp 14   Wt 175 lb 4.8 oz (79.5 kg)   SpO2 96%   BMI 22.51 kg/m   Wt Readings from Last 3 Encounters:  06/01/16 175 lb 4.8 oz (79.5 kg)  05/02/16 168 lb 4.8 oz (76.3 kg)  01/04/16 180 lb (81.6 kg)    Physical Exam  Constitutional: He appears well-developed and well-nourished.  Cardiovascular: Normal rate.   Pulmonary/Chest: Effort normal.  Abdominal: Normal appearance. He exhibits no distension. There is no tenderness.  Genitourinary: Rectal exam shows internal hemorrhoid. Rectal exam shows no external hemorrhoid, no fissure, no mass, no tenderness and anal tone normal.  Genitourinary Comments: Soft compressible internal hemorrhoid, no bleeding, no hard mass  Skin: No pallor.  Psychiatric: His mood appears not anxious. He does not exhibit a depressed mood.      Assessment & Plan:   Problem List Items Addressed This Visit    None    Visit Diagnoses    Internal hemorrhoid    -  Primary   explained dx; recommended increasing fiber in diet, increase water; let me know if symptoms not  resolving over next several weeks; no red flags       Follow up plan: Return if symptoms worsen or fail to improve.  An after-visit summary was printed and given to the patient at check-out.  Please see the patient instructions which may contain other information and recommendations beyond what is mentioned above in the assessment and plan.

## 2016-07-28 ENCOUNTER — Encounter (HOSPITAL_COMMUNITY): Payer: Self-pay | Admitting: Emergency Medicine

## 2016-07-28 ENCOUNTER — Emergency Department (HOSPITAL_COMMUNITY)
Admission: EM | Admit: 2016-07-28 | Discharge: 2016-07-28 | Disposition: A | Discharge status: Home / Self Care | Payer: 59 | Attending: Emergency Medicine | Admitting: Emergency Medicine

## 2016-07-28 DIAGNOSIS — S0501XA Injury of conjunctiva and corneal abrasion without foreign body, right eye, initial encounter: Secondary | ICD-10-CM | POA: Diagnosis not present

## 2016-07-28 DIAGNOSIS — F17200 Nicotine dependence, unspecified, uncomplicated: Secondary | ICD-10-CM | POA: Insufficient documentation

## 2016-07-28 DIAGNOSIS — Z79899 Other long term (current) drug therapy: Secondary | ICD-10-CM | POA: Insufficient documentation

## 2016-07-28 DIAGNOSIS — H02843 Edema of right eye, unspecified eyelid: Secondary | ICD-10-CM | POA: Diagnosis present

## 2016-07-28 DIAGNOSIS — I10 Essential (primary) hypertension: Secondary | ICD-10-CM | POA: Diagnosis not present

## 2016-07-28 DIAGNOSIS — Y999 Unspecified external cause status: Secondary | ICD-10-CM | POA: Diagnosis not present

## 2016-07-28 DIAGNOSIS — Y929 Unspecified place or not applicable: Secondary | ICD-10-CM | POA: Diagnosis not present

## 2016-07-28 DIAGNOSIS — Y939 Activity, unspecified: Secondary | ICD-10-CM | POA: Insufficient documentation

## 2016-07-28 DIAGNOSIS — Y33XXXA Other specified events, undetermined intent, initial encounter: Secondary | ICD-10-CM | POA: Diagnosis not present

## 2016-07-28 DIAGNOSIS — H02841 Edema of right upper eyelid: Secondary | ICD-10-CM | POA: Diagnosis not present

## 2016-07-28 MED ORDER — ERYTHROMYCIN 5 MG/GM OP OINT: 1.0000 "application " | TOPICAL_OINTMENT | Freq: Four times a day (QID) | OPHTHALMIC | 0 refills | Status: DC

## 2016-07-28 MED ORDER — PROPARACAINE HCL 0.5 % OP SOLN
1.0000 [drp] | Freq: Once | OPHTHALMIC | Status: AC
  Administered 2016-07-28: 1 [drp] via OPHTHALMIC
  Filled 2016-07-28: qty 15

## 2016-07-28 MED ORDER — FLUORESCEIN SODIUM 0.6 MG OP STRP
1.0000 | ORAL_STRIP | Freq: Once | OPHTHALMIC | Status: AC
Start: 2016-07-28 — End: 2016-07-28
  Administered 2016-07-28: 1 via OPHTHALMIC
  Filled 2016-07-28: qty 1

## 2016-07-28 MED ORDER — CLINDAMYCIN HCL 150 MG PO CAPS: 300.0000 mg | ORAL_CAPSULE | Freq: Three times a day (TID) | ORAL | 0 refills | Status: DC

## 2016-07-28 NOTE — ED Triage Notes (Signed)
Pt to ER for eval of right eye swelling and clear drainage with itching onset yesterday. Denies difficulty seeing.

## 2016-07-28 NOTE — ED Notes (Signed)
Pt understood dc material. NAD noted. SCripts given at dc 

## 2016-07-28 NOTE — ED Provider Notes (Signed)
[facility]-EMERGENCY DEPT Provider Note   CSN: [id number] Arrival date & time: 07/28/16  1724     History   Chief Complaint Chief Complaint  Patient presents with  . Eye Problem    HPI Jason Hale is a 26 y.o. male.  26 year old male who presents with right eye swelling and itching. Yesterday, the patient began having itching and irritation of his right eye. He states that it felt like he had something in his eye but he is not aware of any foreign body. He has had tearing and some mild drainage with crusting after he has been sleeping. No pain with extraocular movements. Today he has had worsening swelling of his upper eyelid. He denies any recent illness including no fever, cough/cold symptoms. He was given some prescription eye ointment by a friend that he used twice yesterday.   The history is provided by the patient.    Past Medical History:  Diagnosis Date  . Hypertension   . IBS (irritable bowel syndrome)     Patient Active Problem List   Diagnosis Date Noted  . Dyspepsia 05/16/2016  . Bronchospasm 11/01/2015  . Hypertension goal BP (blood pressure) < 140/90 04/05/2015  . Intermittent dysphagia 04/05/2015    Past Surgical History:  Procedure Laterality Date  . APPENDECTOMY    . ESOPHAGOGASTRODUODENOSCOPY N/A 07/28/2014   Procedure: ESOPHAGOGASTRODUODENOSCOPY (EGD);  Surgeon: Dorena CookeyJohn Hayes, MD;  [facility];  Service: Endoscopy;  Laterality: N/A;  . FOREIGN BODY REMOVAL N/A 07/28/2014   Procedure: FOREIGN BODY REMOVAL;  Surgeon: Dorena CookeyJohn Hayes, MD;  [facility];  Service: Endoscopy;  Laterality: N/A;  . TONSILLECTOMY         Home Medications    Prior to Admission medications   Medication Sig Start Date End Date Taking? Authorizing Provider  albuterol (PROVENTIL HFA;VENTOLIN HFA) 108 (90 Base) MCG/ACT inhaler Inhale 2 puffs into the lungs every 4 (four) hours as needed for wheezing or shortness of breath. 11/01/15   Kerman PasseyLada, Melinda P, MD  clindamycin  (CLEOCIN) 150 MG capsule Take 2 capsules (300 mg total) by mouth 3 (three) times daily. 07/28/16   Little, Ambrose Finlandachel Morgan, MD  erythromycin ophthalmic ointment Place 1 application into the right eye every 6 (six) hours. Place 1/2 inch ribbon of ointment in the affected eye 4 times a day for 5 days 07/28/16   Little, Ambrose Finlandachel Morgan, MD  ranitidine (ZANTAC) 150 MG/10ML syrup Take 10 mLs (150 mg total) by mouth 2 (two) times daily. If needed Patient not taking: Reported on 01/04/2016 11/01/15   Kerman PasseyLada, Melinda P, MD    Family History Family History  Problem Relation Age of Onset  . Hypertension Mother   . Diabetes Mother   . Asthma Son   . Heart disease Maternal Grandfather   . Heart attack Maternal Grandfather     Social History Social History  Substance Use Topics  . Smoking status: Current Every Day Smoker  . Smokeless tobacco: Never Used  . Alcohol use No     Allergies   Patient has no known allergies.   Review of Systems Review of Systems  Constitutional: Negative for fever.  Eyes: Positive for pain, discharge and itching. Negative for photophobia and visual disturbance.  Respiratory: Negative for cough.      Physical Exam Updated Vital Signs BP (!) 146/91   Pulse (!) 59   Temp 97.9 F (36.6 C) (Oral)   Resp 18   Ht 6\' 2"  (1.88 m)   Wt 81.6 kg (180  lb)   SpO2 99%   BMI 23.11 kg/m   Physical Exam  Constitutional: He is oriented to person, place, and time. He appears well-developed and well-nourished. No distress.  HENT:  Head: Normocephalic and atraumatic.  Eyes: EOM are normal. Pupils are equal, round, and reactive to light.  R eye upper eyelid swelling and mild erythema; mild R conjunctival and scleral injection with tearing of eye, clear drainage; L eye normal in appearance Small area of fluorescein uptake of lower eye below iris suggestive of abrasion  Neck: Neck supple.  Neurological: He is alert and oriented to person, place, and time.  Skin: Skin is warm  and dry.  Psychiatric: He has a normal mood and affect. Judgment normal.  Nursing note and vitals reviewed.    ED Treatments / Results  Labs (all labs ordered are listed, but only abnormal results are displayed) Labs Reviewed - No data to display  EKG  EKG Interpretation None       Radiology No results found.  Procedures Procedures (including critical care time)  Medications Ordered in ED Medications  fluorescein ophthalmic strip 1 strip (1 strip Right Eye Given 07/28/16 1942)  proparacaine (ALCAINE) 0.5 % ophthalmic solution 1 drop (1 drop Both Eyes Given 07/28/16 1942)     Initial Impression / Assessment and Plan / ED Course  I have reviewed the triage vital signs and the nursing notes.    1d R eye irritation, itching, tearing, and upper eyelid swelling. Small Corneal abrasion noted on floor seen staining with no ulceration. No pain with extraocular movements to suggest orbital cellulitis. Differential includes allergic reaction from foreign body that caused abrasion versus early preorbital cellulitis. Provided with erythromycin ointment for corneal abrasion as well as clindamycin to cover for preorbital cellulitis. I recommended warm compresses and a dose of Benadryl when he gets home in the event that some of his symptoms are due to allergic reaction. Extensively reviewed return precautions with him including any eye pain, pain with movements, worsening symptoms, or fever. Patient voiced understanding and was discharged in satisfactory condition.  Final Clinical Impressions(s) / ED Diagnoses   Final diagnoses:  Abrasion of right cornea, initial encounter  Swelling of right eyelid    New Prescriptions Discharge Medication List as of 07/28/2016  8:26 PM    START taking these medications   Details  clindamycin (CLEOCIN) 150 MG capsule Take 2 capsules (300 mg total) by mouth 3 (three) times daily., Starting Sat 07/28/2016, Print    erythromycin ophthalmic ointment Place  1 application into the right eye every 6 (six) hours. Place 1/2 inch ribbon of ointment in the affected eye 4 times a day for 5 days, Starting Sat 07/28/2016, Print         Little, Ambrose Finland, MD 07/28/16 2102

## 2016-07-30 ENCOUNTER — Ambulatory Visit: Payer: Self-pay | Admitting: Physician Assistant

## 2016-07-31 ENCOUNTER — Ambulatory Visit (INDEPENDENT_AMBULATORY_CARE_PROVIDER_SITE_OTHER): Payer: 59 | Admitting: Family Medicine

## 2016-07-31 ENCOUNTER — Encounter: Payer: Self-pay | Admitting: Family Medicine

## 2016-07-31 VITALS — BP 120/84 | HR 74 | Temp 97.8°F | Resp 16 | Ht 74.0 in | Wt 175.9 lb

## 2016-07-31 DIAGNOSIS — H00011 Hordeolum externum right upper eyelid: Secondary | ICD-10-CM | POA: Diagnosis not present

## 2016-07-31 DIAGNOSIS — R590 Localized enlarged lymph nodes: Secondary | ICD-10-CM

## 2016-07-31 NOTE — Progress Notes (Addendum)
Name: Jason Hale   MRN: [id number]    DOB: 05-07-1990   Date:07/31/2016       Progress Note  Subjective  Chief Complaint  Chief Complaint  Patient presents with  . Cyst    below right ear    HPI  Pt presents with several years of intermittent right-sided preauricular and parotid lymph node swelling. He is having this swelling currently, and states there is not pain or tenderness, no fevers/chills, and no pattern to when the swelling occurs - not necessarily when he is sick versus well, not a particular time of year or time of day.  Endorses occasional coughing. No throat pain, nasal congestion, no shortness of breath, chest pain. Stopped smoking cigarettes 2 years ago - used to smoke 4-5 cigarettes a day. Now smokes marijuana 3-4 times a day.  He does currently have swelling to the RIGHT eye due to an already treated abrasion of right cornea with eyelid infection - treated on 07/28/2016 is taking Clindamycin and using erythromycin. Saw opthalmology today and is returning in 3 days for I&D.   Is current smoker as above, has family history of cancer: Stomach - Maternal GM Throat - Maternal Aunt - is also a smoker.  Patient Active Problem List   Diagnosis Date Noted  . Dyspepsia 05/16/2016  . Bronchospasm 11/01/2015  . Hypertension goal BP (blood pressure) < 140/90 04/05/2015  . Intermittent dysphagia 04/05/2015    Social History  Substance Use Topics  . Smoking status: Current Every Day Smoker  . Smokeless tobacco: Never Used  . Alcohol use No    Current Outpatient Prescriptions:  .  albuterol (PROVENTIL HFA;VENTOLIN HFA) 108 (90 Base) MCG/ACT inhaler, Inhale 2 puffs into the lungs every 4 (four) hours as needed for wheezing or shortness of breath., Disp: 1 Inhaler, Rfl: 1 .  clindamycin (CLEOCIN) 150 MG capsule, Take 2 capsules (300 mg total) by mouth 3 (three) times daily., Disp: 42 capsule, Rfl: 0 .  erythromycin ophthalmic ointment, Place 1 application into the right eye  every 6 (six) hours. Place 1/2 inch ribbon of ointment in the affected eye 4 times a day for 5 days, Disp: 1 g, Rfl: 0 .  ranitidine (ZANTAC) 150 MG/10ML syrup, Take 10 mLs (150 mg total) by mouth 2 (two) times daily. If needed, Disp: 300 mL, Rfl: 2  No Known Allergies  ROS  Ten systems reviewed and is negative except as mentioned in HPI   Objective  Vitals:   07/31/16 1050  BP: 120/84  Pulse: 74  Resp: 16  Temp: 97.8 F (36.6 C)  TempSrc: Oral  SpO2: 95%  Weight: 175 lb 14.4 oz (79.8 kg)  Height: 6\' 2"  (1.88 m)   Body mass index is 22.58 kg/m.  Nursing Note and Vital Signs reviewed.  Physical Exam  Constitutional: Patient appears well-developed and well-nourished.  No distress.  HEENT: head atraumatic, normocephalic, pupils equal and reactive to light, EOM's intact, neck supple with lymphadenopathy to RIGHT parotid and preauricular nodes - non-tender on palpation, oropharynx pink and moist without exudate Cardiovascular: Normal rate, regular rhythm, S1/S2 present.  No murmur or rub heard. No BLE edema. Pulmonary/Chest: Effort normal and breath sounds clear. No respiratory distress or retractions. Psychiatric: Patient has a normal mood and affect. behavior is normal. Judgment and thought content normal.  Recent Results (from the past 2160 hour(s))  HIV antibody (with reflex)     Status: None   Collection Time: 05/02/16 11:11 AM  Result Value Ref Range  HIV 1&2 Ab, 4th Generation NONREACTIVE NONREACTIVE    Comment:   HIV-1 antigen and HIV-1/HIV-2 antibodies were not detected.  There is no laboratory evidence of HIV infection.   HIV-1/2 Antibody Diff        Not indicated. HIV-1 RNA, Qual TMA          Not indicated.     PLEASE NOTE: This information has been disclosed to you from records whose confidentiality may be protected by state law. If your state requires such protection, then the state law prohibits you from making any further disclosure of the information  without the specific written consent of the person to whom it pertains, or as otherwise permitted by law. A general authorization for the release of medical or other information is NOT sufficient for this purpose.   The performance of this assay has not been clinically validated in patients less than 57 years old.   For additional information please refer to http://education.[facility].com/faq/FAQ106.  (This link is being provided for informational/educational purposes only.)     RPR     Status: None   Collection Time: 05/02/16 11:11 AM  Result Value Ref Range   RPR Ser Ql NON REAC NON REAC  Hepatitis, Acute     Status: None   Collection Time: 05/02/16 11:11 AM  Result Value Ref Range   Hepatitis B Surface Ag NEGATIVE NEGATIVE   HCV Ab NEGATIVE NEGATIVE   Hep B C IgM NON REACTIVE NON REACTIVE    Comment: High levels of Hepatitis B Core IgM antibody are detectable during the acute stage of Hepatitis B. This antibody is used to differentiate current from past HBV infection.      Hep A IgM NON REACTIVE NON REACTIVE    Comment:   Effective December 21, 2013, Hepatitis Acute Panel (test code 16109) will be revised to automatically reflex to the Hepatitis C Viral RNA, Quantitative, Real-Time PCR assay if the Hepatitis C antibody screening result is Reactive. This action is being taken to ensure that the CDC/USPSTF recommended HCV diagnostic algorithm with the appropriate test reflex needed for accurate interpretation is followed.     Trichomonas vaginalis RNA, Ql,Males     Status: None   Collection Time: 05/02/16 11:11 AM  Result Value Ref Range   Trichomonas vaginalis RNA Not Detected Not Detected    Comment:   This test was performed using the APTIMA(R) Trichomonas vaginalis Assay (GEN-PROBE(R)). The analytical performance characteristics of this assay have been determined by [employer]. The modifications have not been cleared or approved by the FDA. This  assay has been validated pursuant to the CLIA regulations and is used for clinical purposes.   For more information on this test, go to: http://education.[facility].com/faq/Trichomonastma   POC Hemoccult Bld/Stl (1-Cd Office Dx)     Status: Normal   Collection Time: 06/01/16  9:39 AM  Result Value Ref Range   Card #1 Date [id number]    Fecal Occult Blood, POC Negative Negative     Assessment & Plan  1. Preauricular lymphadenopathy - Ambulatory referral to ENT - Discussed likelihood that lymph nodes are enlarged today due to right-sided eye swelling and infectious process. He verbalizes understanding but prefers to see ENT due to this being an ongoing issue.  He is advised to note any pattern to the node enlargement in preparation for this appointment.  - Discussed need to take ABX that were prescribed to him as prescribed to prevent resistance and to promote healing.  He is in agreement  and is seeing Opthalmology on Friday for I&D of right eyelid per HPI.  -Red flags and when to present for emergency care or RTC including fever >101.15F, chest pain, shortness of  breath, new/worsening/un-resolving symptoms, lymph node that remains enlarged, any signs of worsening infection. reviewed with patient at time of visit. Follow up and care instructions discussed and provided in AVS.  I have reviewed this encounter including the documentation in this note and/or discussed this patient with the Deboraha Sprangprovider,Aziya Arena, FNP, NP-C. I am certifying that I agree with the content of this note as supervising physician.  Alba CoryKrichna Sowles, MD [facility] 07/31/2016, 8:18 PM

## 2016-08-03 DIAGNOSIS — H00011 Hordeolum externum right upper eyelid: Secondary | ICD-10-CM | POA: Diagnosis not present

## 2016-10-15 ENCOUNTER — Emergency Department (HOSPITAL_COMMUNITY)
Admission: EM | Admit: 2016-10-15 | Discharge: 2016-10-15 | Disposition: A | Discharge status: Home / Self Care | Payer: 59 | Attending: Emergency Medicine | Admitting: Emergency Medicine

## 2016-10-15 ENCOUNTER — Encounter (HOSPITAL_COMMUNITY): Payer: Self-pay | Admitting: Emergency Medicine

## 2016-10-15 DIAGNOSIS — W228XXA Striking against or struck by other objects, initial encounter: Secondary | ICD-10-CM | POA: Insufficient documentation

## 2016-10-15 DIAGNOSIS — I10 Essential (primary) hypertension: Secondary | ICD-10-CM | POA: Insufficient documentation

## 2016-10-15 DIAGNOSIS — Y929 Unspecified place or not applicable: Secondary | ICD-10-CM | POA: Insufficient documentation

## 2016-10-15 DIAGNOSIS — F17200 Nicotine dependence, unspecified, uncomplicated: Secondary | ICD-10-CM | POA: Insufficient documentation

## 2016-10-15 DIAGNOSIS — Y9389 Activity, other specified: Secondary | ICD-10-CM | POA: Diagnosis not present

## 2016-10-15 DIAGNOSIS — Y999 Unspecified external cause status: Secondary | ICD-10-CM | POA: Diagnosis not present

## 2016-10-15 DIAGNOSIS — S6992XA Unspecified injury of left wrist, hand and finger(s), initial encounter: Secondary | ICD-10-CM | POA: Diagnosis present

## 2016-10-15 DIAGNOSIS — S61213A Laceration without foreign body of left middle finger without damage to nail, initial encounter: Secondary | ICD-10-CM | POA: Diagnosis not present

## 2016-10-15 MED ORDER — BUPIVACAINE HCL (PF) 0.5 % IJ SOLN
10.0000 mL | Freq: Once | INTRAMUSCULAR | Status: AC
  Administered 2016-10-15: 10 mL
  Filled 2016-10-15: qty 30

## 2016-10-15 NOTE — Discharge Instructions (Signed)
You may remove the bandage after 24 hours. Clean the wound and surrounding area gently with tap water and mild soap. Rinse well and blot dry. Do not scrub the wound, as this may cause the wound edges to come apart. You may shower, but avoid submerging the wound, such as with a bath or swimming. °Clean the wound daily to prevent infection. Do not use cleaners such as hydrogen peroxide or alcohol.  ° °Scar reduction: Application of a topical antibiotic ointment, such as Neosporin, after the wound has begun to close and heal well can decrease scab formation and reduce scarring. After the wound has healed and wound closures have been removed, application of ointments such as Aquaphor can also reduce scar formation.  °The key to scar reduction is keeping the skin well hydrated and supple. Drinking plenty of water throughout the day (At least eight 8oz glasses of water a day) is essential to staying well hydrated. ° °Sun exposure: Keep the wound out of the sun. After the wound has healed, continue to protect it from the sun by wearing protective clothing or applying sunscreen. ° °Pain: You may use Tylenol, naproxen, or ibuprofen for pain. ° °Suture/staple removal: Return to the ED in 10-12 days for suture removal. ° °Return to the ED sooner should the wound edges come apart or signs of infection arise, such as spreading redness, puffiness/swelling, pus draining from the wound, severe increase in pain, fever over 100.3°F, or any other major issues. °

## 2016-10-15 NOTE — ED Provider Notes (Signed)
[facility]-EMERGENCY DEPT Provider Note   CSN: [id number] Arrival date & time: 10/15/16  1157     History   Chief Complaint Chief Complaint  Patient presents with  . Laceration  . Finger Injury    HPI Jason Hale is a 26 y.o. male.  HPI   Jason Hale is a 26 y.o. male, with a history of HTN, presenting to the ED with Lacerations to the left ring and middle fingers sustained about an hour prior to arrival. Patient states he was opening a metal can when his hand slipped and he cut his finger on the sharp edge of the can. States pain is minimal, throbbing, nonradiating. Denies numbness, weakness, other injuries, or any other complaints. Up-to-date on tetanus.     Past Medical History:  Diagnosis Date  . Hypertension   . IBS (irritable bowel syndrome)     Patient Active Problem List   Diagnosis Date Noted  . Dyspepsia 05/16/2016  . Bronchospasm 11/01/2015  . Hypertension goal BP (blood pressure) < 140/90 04/05/2015  . Intermittent dysphagia 04/05/2015    Past Surgical History:  Procedure Laterality Date  . APPENDECTOMY    . ESOPHAGOGASTRODUODENOSCOPY N/A 07/28/2014   Procedure: ESOPHAGOGASTRODUODENOSCOPY (EGD);  Surgeon: Dorena Cookey, MD;  Location: [facility] ENDOSCOPY;  Service: Endoscopy;  Laterality: N/A;  . FOREIGN BODY REMOVAL N/A 07/28/2014   Procedure: FOREIGN BODY REMOVAL;  Surgeon: Dorena Cookey, MD;  Location: [facility] ENDOSCOPY;  Service: Endoscopy;  Laterality: N/A;  . TONSILLECTOMY    . WISDOM TOOTH EXTRACTION         Home Medications    Prior to Admission medications   Medication Sig Start Date End Date Taking? Authorizing Provider  albuterol (PROVENTIL HFA;VENTOLIN HFA) 108 (90 Base) MCG/ACT inhaler Inhale 2 puffs into the lungs every 4 (four) hours as needed for wheezing or shortness of breath. 11/01/15  Yes Lada, Janit Bern, MD  ranitidine (ZANTAC) 150 MG/10ML syrup Take 10 mLs (150 mg total) by mouth 2 (two) times daily. If needed Patient not taking: Reported on  10/15/2016 11/01/15   Kerman Passey, MD    Family History Family History  Problem Relation Age of Onset  . Hypertension Mother   . Diabetes Mother   . Asthma Son   . Heart disease Maternal Grandfather   . Heart attack Maternal Grandfather     Social History Social History  Substance Use Topics  . Smoking status: Current Some Day Smoker  . Smokeless tobacco: Never Used  . Alcohol use No     Allergies   Patient has no known allergies.   Review of Systems Review of Systems  Skin: Positive for wound.  Neurological: Negative for weakness and numbness.     Physical Exam Updated Vital Signs BP 138/89 (BP Location: Left Arm)   Pulse 60   Temp 98.3 F (36.8 C) (Oral)   Resp 18   SpO2 100%   Physical Exam  Constitutional: He appears well-developed and well-nourished. No distress.  HENT:  Head: Normocephalic and atraumatic.  Eyes: Conjunctivae are normal.  Neck: Neck supple.  Cardiovascular: Normal rate, regular rhythm and intact distal pulses.   Pulmonary/Chest: Effort normal.  Musculoskeletal: He exhibits tenderness. He exhibits no edema or deformity.  Full range of motion in the fingers of the left hand and each of the DIP, PIP, and MCP joints.  Neurological: He is alert.  No noted sensory deficits to the left middle or ring fingers. Strength is 5/5 tested with flexion and extension at  the DIP, PIP, and MCP joints of these fingers.  Skin: Skin is warm and dry. Capillary refill takes less than 2 seconds. He is not diaphoretic. No pallor.  1.5 cm laceration to the distal, dorsal, ulnar side of the left middle finger. No noted nail damage.  1.5 cm partial avulsion to the distal, radial side of the left ring finger. This wound includes a very small tip of the patient's nail, but no noted repairable damage to the nail bed. The remaining skin flap was evaluated for viability and noted to be pale and without capillary refill. Following the digital block, this area of skin  was excised with no subsequent hemorrhage noted. QR powder was applied to the avulsion for continued hemorrhage control.  No noted foreign bodies in either wound.    Psychiatric: He has a normal mood and affect. His behavior is normal.  Nursing note and vitals reviewed.        ED Treatments / Results  Labs (all labs ordered are listed, but only abnormal results are displayed) Labs Reviewed - No data to display  EKG  EKG Interpretation None       Radiology No results found.  Procedures .Nerve Block Date/Time: 10/15/2016 2:30 PM Performed by: Anselm PancoastJOY, Carson Meche C Authorized by: Anselm PancoastJOY, Kaylany Tesoriero C   Consent:    Consent obtained:  Verbal   Consent given by:  Patient   Risks discussed:  Infection, swelling, unsuccessful block and pain Indications:    Indications:  Procedural anesthesia Location:    Body area:  Upper extremity   Upper extremity nerve blocked: Digital, left middle finger.   Laterality:  Left Pre-procedure details:    Skin preparation:  Povidone-iodine Procedure details (see MAR for exact dosages):    Block needle gauge:  25 G   Anesthetic injected:  Bupivacaine 0.5% w/o epi   Injection procedure:  Anatomic landmarks identified, anatomic landmarks palpated, incremental injection, introduced needle and negative aspiration for blood Post-procedure details:    Outcome:  Anesthesia achieved   Patient tolerance of procedure:  Tolerated well, no immediate complications .Nerve Block Date/Time: 10/15/2016 2:35 PM Performed by: Anselm PancoastJOY, Cleota Pellerito C Authorized by: Anselm PancoastJOY, Lyndee Herbst C   Consent:    Consent obtained:  Verbal   Consent given by:  Patient   Risks discussed:  Infection, swelling, unsuccessful block and pain Indications:    Indications:  Procedural anesthesia Location:    Body area:  Upper extremity   Upper extremity nerve blocked: Digital, left ring finger.   Laterality:  Left Pre-procedure details:    Skin preparation:  Povidone-iodine Procedure details (see MAR for  exact dosages):    Block needle gauge:  25 G   Anesthetic injected:  Bupivacaine 0.5% w/o epi   Injection procedure:  Anatomic landmarks identified, anatomic landmarks palpated, incremental injection, introduced needle and negative aspiration for blood Post-procedure details:    Outcome:  Anesthesia achieved   Patient tolerance of procedure:  Tolerated well, no immediate complications .Marland Kitchen.Laceration Repair Date/Time: 10/15/2016 2:58 PM Performed by: Anselm PancoastJOY, Mindy Behnken C Authorized by: Anselm PancoastJOY, Shante Maysonet C   Consent:    Consent obtained:  Verbal   Consent given by:  Patient   Risks discussed:  Infection, need for additional repair, poor wound healing, poor cosmetic result and pain Anesthesia (see MAR for exact dosages):    Anesthesia method:  Nerve block   Block location:  Digital   Block needle gauge:  25 G   Block anesthetic:  Bupivacaine 0.5% w/o epi   Block injection  procedure:  Anatomic landmarks identified, anatomic landmarks palpated, introduced needle, negative aspiration for blood and incremental injection   Block outcome:  Anesthesia achieved Laceration details:    Location:  Finger   Finger location:  L long finger   Length (cm):  1.5 Repair type:    Repair type:  Simple Pre-procedure details:    Preparation:  Patient was prepped and draped in usual sterile fashion Exploration:    Hemostasis achieved with:  Direct pressure   Wound exploration: wound explored through full range of motion and entire depth of wound probed and visualized   Treatment:    Area cleansed with:  Saline and Betadine   Amount of cleaning:  Extensive   Irrigation solution:  Tap water   Irrigation method:  Tap Skin repair:    Repair method:  Sutures   Suture size:  5-0   Suture material:  Prolene   Suture technique:  Simple interrupted   Number of sutures:  3 Approximation:    Approximation:  Close Post-procedure details:    Dressing:  Sterile dressing   Patient tolerance of procedure:  Tolerated well, no  immediate complications   (including critical care time)  Medications Ordered in ED Medications  bupivacaine (MARCAINE) 0.5 % injection 10 mL (10 mLs Infiltration Given 10/15/16 1414)     Initial Impression / Assessment and Plan / ED Course  I have reviewed the triage vital signs and the nursing notes.  Pertinent labs & imaging results that were available during my care of the patient were reviewed by me and considered in my medical decision making (see chart for details).      Patient presents with injuries to two of his fingers on the left hand. Laceration repaired and avulsion addressed. Patient to return in 10-12 days for suture removal. The patient was given instructions for home care as well as return precautions. Patient voices understanding of these instructions, accepts the plan, and is comfortable with discharge.    Final Clinical Impressions(s) / ED Diagnoses   Final diagnoses:  Laceration of left middle finger without foreign body without damage to nail, initial encounter    New Prescriptions Discharge Medication List as of 10/15/2016  3:20 PM       Anselm Pancoast, PA-C 10/15/16 2326    Rolland Porter, MD 10/22/16 1055

## 2017-01-21 IMAGING — CR DG CHEST 2V
2 series · 2 of 2 positions shown · non-contrast
Comparison: 10/23/2015

CLINICAL DATA: Substernal chest pain.

EXAM:
CHEST  2 VIEW

[chest pa]
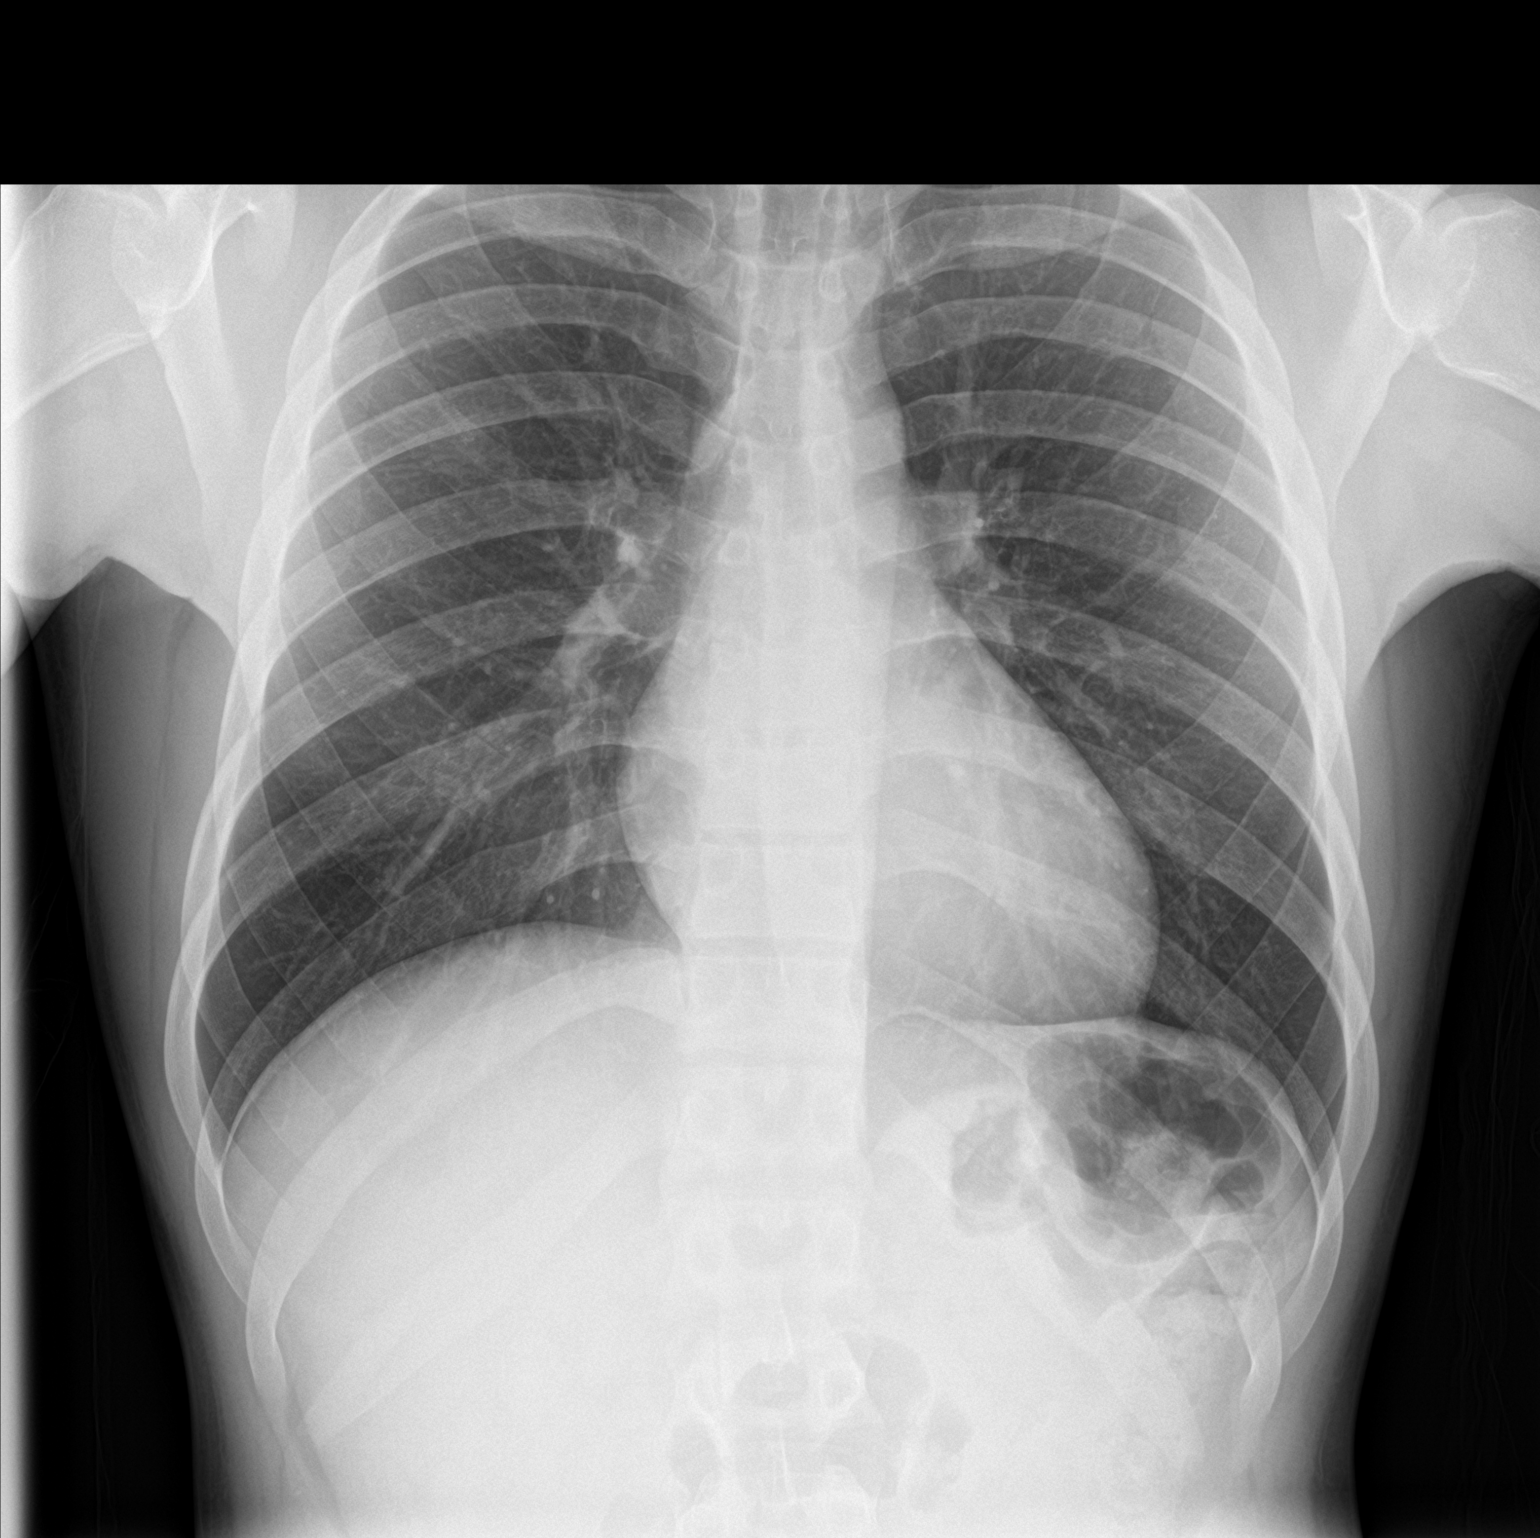

[chest lat]
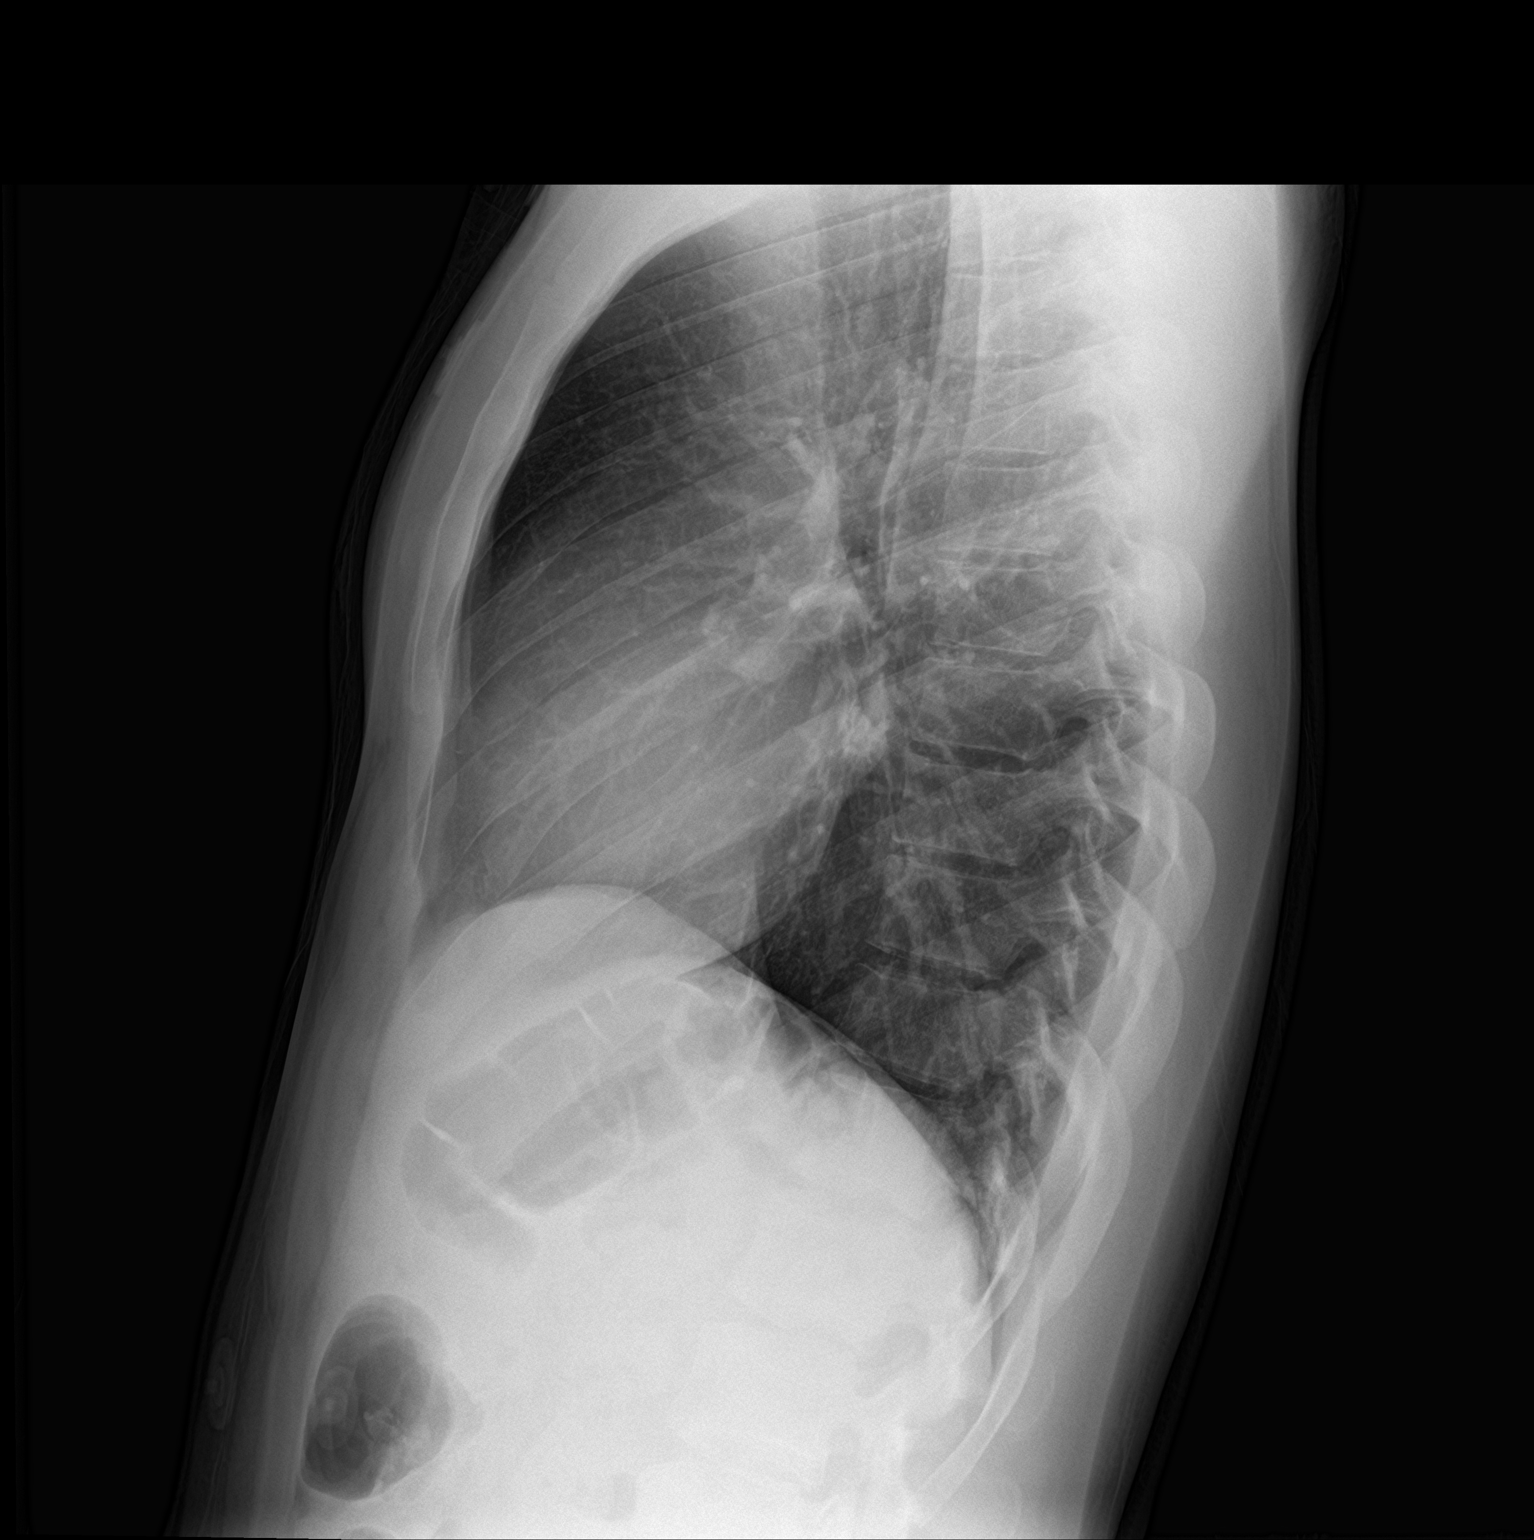

[2 of 2 positions shown; findings below may reference images not displayed]

FINDINGS: The cardiomediastinal silhouette is within normal limits. The lungs
are well inflated and clear. There is no evidence of pleural
effusion or pneumothorax. No acute osseous abnormality is
identified.
IMPRESSION: No active cardiopulmonary disease.

## 2017-03-08 DIAGNOSIS — K222 Esophageal obstruction: Secondary | ICD-10-CM | POA: Diagnosis not present

## 2017-03-11 DIAGNOSIS — H52223 Regular astigmatism, bilateral: Secondary | ICD-10-CM | POA: Diagnosis not present

## 2017-03-29 ENCOUNTER — Emergency Department (HOSPITAL_COMMUNITY): Payer: 59

## 2017-03-29 ENCOUNTER — Encounter (HOSPITAL_COMMUNITY): Payer: Self-pay

## 2017-03-29 DIAGNOSIS — T18120A Food in esophagus causing compression of trachea, initial encounter: Secondary | ICD-10-CM | POA: Diagnosis not present

## 2017-03-29 DIAGNOSIS — I10 Essential (primary) hypertension: Secondary | ICD-10-CM | POA: Insufficient documentation

## 2017-03-29 DIAGNOSIS — F458 Other somatoform disorders: Secondary | ICD-10-CM | POA: Diagnosis not present

## 2017-03-29 DIAGNOSIS — Y999 Unspecified external cause status: Secondary | ICD-10-CM | POA: Insufficient documentation

## 2017-03-29 DIAGNOSIS — Y9389 Activity, other specified: Secondary | ICD-10-CM | POA: Insufficient documentation

## 2017-03-29 DIAGNOSIS — T18128A Food in esophagus causing other injury, initial encounter: Secondary | ICD-10-CM | POA: Insufficient documentation

## 2017-03-29 DIAGNOSIS — F17200 Nicotine dependence, unspecified, uncomplicated: Secondary | ICD-10-CM | POA: Insufficient documentation

## 2017-03-29 DIAGNOSIS — W228XXA Striking against or struck by other objects, initial encounter: Secondary | ICD-10-CM | POA: Insufficient documentation

## 2017-03-29 DIAGNOSIS — Y929 Unspecified place or not applicable: Secondary | ICD-10-CM | POA: Insufficient documentation

## 2017-03-29 NOTE — ED Triage Notes (Signed)
Pt states that yesterday he had some chinese food and feels as if something is stuck in his throat, airway intact, denies SOB, able to swallow secretions. History of narrow esophagus and scheduled to get it stretched next month

## 2017-03-30 ENCOUNTER — Emergency Department (HOSPITAL_COMMUNITY)
Admission: EM | Admit: 2017-03-30 | Discharge: 2017-03-30 | Disposition: A | Discharge status: Home / Self Care | Payer: 59 | Attending: Emergency Medicine | Admitting: Emergency Medicine

## 2017-03-30 DIAGNOSIS — T18128A Food in esophagus causing other injury, initial encounter: Secondary | ICD-10-CM | POA: Diagnosis not present

## 2017-03-30 DIAGNOSIS — I10 Essential (primary) hypertension: Secondary | ICD-10-CM | POA: Diagnosis not present

## 2017-03-30 DIAGNOSIS — F17200 Nicotine dependence, unspecified, uncomplicated: Secondary | ICD-10-CM | POA: Diagnosis not present

## 2017-03-30 MED ORDER — GLUCAGON HCL RDNA (DIAGNOSTIC) 1 MG IJ SOLR
1.0000 mg | Freq: Once | INTRAMUSCULAR | Status: AC
  Administered 2017-03-30: 1 mg via INTRAVENOUS
  Filled 2017-03-30: qty 1

## 2017-03-30 MED ORDER — NITROGLYCERIN 0.4 MG SL SUBL: 0.4000 mg | SUBLINGUAL_TABLET | SUBLINGUAL | Status: DC | PRN

## 2017-03-30 NOTE — ED Notes (Signed)
Pt tolerating PO fluids at this time.

## 2017-03-30 NOTE — ED Provider Notes (Signed)
[facility] EMERGENCY DEPARTMENT Provider Note   CSN: [id number] Arrival date & time: 03/29/17  1907     History   Chief Complaint Chief Complaint  Patient presents with  . Foreign Body    HPI Jason Hale is a 27 y.o. male.  HPI  This is a 27 year old who presents with food impaction.  Patient reports that he ate [place] food over 24 hours ago.  He has a history of dysphasia and previously has had a food impaction requiring EGD.  At that time it was recommended he be scheduled for esophageal stretching.  He reports that he is due for this procedure in a month.  I cannot verify that in the medical record.  Patient reports that he has been unable to tolerate any foods or liquids by mouth.  He reports persistent foreign body sensation.  Patient has tried carbonated beverages with no relief.  Chart reviewed.  He was scoped by Dr. Madilyn Fireman.  I do not see any other follow-up.  Past Medical History:  Diagnosis Date  . Hypertension   . IBS (irritable bowel syndrome)     Patient Active Problem List   Diagnosis Date Noted  . Dyspepsia 05/16/2016  . Bronchospasm 11/01/2015  . Hypertension goal BP (blood pressure) < 140/90 04/05/2015  . Intermittent dysphagia 04/05/2015    Past Surgical History:  Procedure Laterality Date  . APPENDECTOMY    . ESOPHAGOGASTRODUODENOSCOPY N/A 07/28/2014   Procedure: ESOPHAGOGASTRODUODENOSCOPY (EGD);  Surgeon: Dorena Cookey, MD;  Location: [facility] ENDOSCOPY;  Service: Endoscopy;  Laterality: N/A;  . FOREIGN BODY REMOVAL N/A 07/28/2014   Procedure: FOREIGN BODY REMOVAL;  Surgeon: Dorena Cookey, MD;  Location: [facility] ENDOSCOPY;  Service: Endoscopy;  Laterality: N/A;  . TONSILLECTOMY    . WISDOM TOOTH EXTRACTION         Home Medications    Prior to Admission medications   Medication Sig Start Date End Date Taking? Authorizing Provider  albuterol (PROVENTIL HFA;VENTOLIN HFA) 108 (90 Base) MCG/ACT inhaler Inhale 2 puffs into the lungs every 4  (four) hours as needed for wheezing or shortness of breath. 11/01/15   Kerman Passey, MD  ranitidine (ZANTAC) 150 MG/10ML syrup Take 10 mLs (150 mg total) by mouth 2 (two) times daily. If needed Patient not taking: Reported on 10/15/2016 11/01/15   Kerman Passey, MD    Family History Family History  Problem Relation Age of Onset  . Hypertension Mother   . Diabetes Mother   . Asthma Son   . Heart disease Maternal Grandfather   . Heart attack Maternal Grandfather     Social History Social History   Tobacco Use  . Smoking status: Current Some Day Smoker  . Smokeless tobacco: Never Used  Substance Use Topics  . Alcohol use: No  . Drug use: Yes    Types: Marijuana     Allergies   Patient has no known allergies.   Review of Systems Review of Systems  Respiratory: Negative for shortness of breath.   Cardiovascular: Negative for chest pain.  Gastrointestinal: Negative for nausea and vomiting.       Foreign body sensation  All other systems reviewed and are negative.    Physical Exam Updated Vital Signs BP (!) 158/107   Pulse 67   Temp 98.4 F (36.9 C) (Oral)   Resp 16   Ht 6\' 2"  (1.88 m)   Wt 86.2 kg (190 lb)   SpO2 99%   BMI 24.39 kg/m   Physical  Exam  Constitutional: He is oriented to person, place, and time. He appears well-developed and well-nourished. No distress.  Tolerating secretions  HENT:  Head: Normocephalic and atraumatic.  Cardiovascular: Normal rate, regular rhythm and normal heart sounds.  No murmur heard. Pulmonary/Chest: Effort normal and breath sounds normal. No respiratory distress. He has no wheezes.  Abdominal: Soft. Bowel sounds are normal. There is no tenderness. There is no rebound.  Musculoskeletal: He exhibits no edema.  Neurological: He is alert and oriented to person, place, and time.  Skin: Skin is warm and dry.  Psychiatric: He has a normal mood and affect.  Nursing note and vitals reviewed.    ED Treatments / Results    Labs (all labs ordered are listed, but only abnormal results are displayed) Labs Reviewed - No data to display  EKG  EKG Interpretation None       Radiology Dg Neck Soft Tissue  Result Date: 03/29/2017 CLINICAL DATA:  Globus sensation. Patient feels he has a piece of chicken or rice stuck in his throat and and has been unable to drink or eat anything since 10:30 p.m. last evening. EXAM: NECK SOFT TISSUES - 1+ VIEW COMPARISON:  None. FINDINGS: There is no evidence of retropharyngeal soft tissue swelling or epiglottic enlargement. The cervical airway is unremarkable. There is slight soft tissue prominence along the expected location of the aryepiglottic folds that could potentially represent some inflammatory thickening. The possibility of a food bolus is not entirely excluded but believed less likely. CT of the neck may help from an imaging standpoint. IMPRESSION: No retropharyngeal soft tissue swelling. No radiopaque foreign body is identified. Slight soft tissue fullness in the region of the area epiglottic folds which could represent a mild inflammatory thickening. Soft tissue food bolus is not entirely excluded but believed less likely. Electronically Signed   By: Tollie Eth M.D.   On: 03/29/2017 20:00    Procedures Procedures (including critical care time)  Medications Ordered in ED Medications  nitroGLYCERIN (NITROSTAT) SL tablet 0.4 mg (not administered)  glucagon (human recombinant) (GLUCAGEN) injection 1 mg (1 mg Intravenous Given 03/30/17 0322)     Initial Impression / Assessment and Plan / ED Course  I have reviewed the triage vital signs and the nursing notes.  Pertinent labs & imaging results that were available during my care of the patient were reviewed by me and considered in my medical decision making (see chart for details).  Clinical Course as of Mar 30 426  Sat Mar 30, 2017  0306 Discussed with Dr. Ewing Schlein, recommends glucagon and nitro.  [CH]    Clinical  Course User Index [CH] Elicia Lui, Mayer Masker, MD    Patient presents with over 24 hours of foreign body sensation and likely food impaction.  He does have difficulty swallowing liquids.  He does not actively vomit but spits into the trash can after drinking liquids.  Given duration of symptoms, this was discussed with Dr. Ewing Schlein, GI.  He recommends glucagon and nitroglycerin to see if this resolves the patient's symptoms.  If not, I will call him in follow-up.  4:28 AM Informed patient had significant improvement of foreign body sensation.  He is now able to tolerate fluids.  Will discharge with close GI follow-up.  Message sent to Dr. Ewing Schlein.  After history, exam, and medical workup I feel the patient has been appropriately medically screened and is safe for discharge home. Pertinent diagnoses were discussed with the patient. Patient was given return precautions.   Final  Clinical Impressions(s) / ED Diagnoses   Final diagnoses:  Food impaction of esophagus, initial encounter    ED Discharge Orders    None       Aleese Kamps, Mayer Maskerourtney F, MD 03/30/17 [phone]

## 2017-03-30 NOTE — Discharge Instructions (Signed)
You were seen today with concerns for food bolus and impaction.  This was resolved in the emergency room.  It is very important that you follow-up with gastroenterology for definitive care.  Make sure to eat soft foods and small portions.

## 2017-04-05 HISTORY — PX: OTHER SURGICAL HISTORY: SHX169

## 2017-04-09 DIAGNOSIS — H18461 Peripheral corneal degeneration, right eye: Secondary | ICD-10-CM | POA: Diagnosis not present

## 2017-04-09 DIAGNOSIS — H04123 Dry eye syndrome of bilateral lacrimal glands: Secondary | ICD-10-CM | POA: Diagnosis not present

## 2017-05-01 DIAGNOSIS — R1310 Dysphagia, unspecified: Secondary | ICD-10-CM | POA: Diagnosis not present

## 2017-05-01 DIAGNOSIS — K200 Eosinophilic esophagitis: Secondary | ICD-10-CM | POA: Diagnosis not present

## 2017-05-01 MED FILL — OMEPRAZOLE DR 40 MG CAPSULE: 40 | 90 days supply | Qty: 180 | Fill #0

## 2017-05-01 MED FILL — FLOVENT HFA 220 MCG INHALER: 220 | 30 days supply | Qty: 12 | Fill #0

## 2017-06-28 ENCOUNTER — Encounter: Payer: Self-pay | Admitting: Family Medicine

## 2017-06-28 ENCOUNTER — Ambulatory Visit: Payer: 59 | Admitting: Family Medicine

## 2017-06-28 ENCOUNTER — Ambulatory Visit (INDEPENDENT_AMBULATORY_CARE_PROVIDER_SITE_OTHER): Payer: 59 | Admitting: Family Medicine

## 2017-06-28 VITALS — BP 116/74 | HR 90 | Temp 98.4°F | Resp 12 | Ht 74.0 in | Wt 186.4 lb

## 2017-06-28 DIAGNOSIS — R208 Other disturbances of skin sensation: Secondary | ICD-10-CM | POA: Diagnosis not present

## 2017-06-28 DIAGNOSIS — R1319 Other dysphagia: Secondary | ICD-10-CM | POA: Diagnosis not present

## 2017-06-28 DIAGNOSIS — I10 Essential (primary) hypertension: Secondary | ICD-10-CM

## 2017-06-28 MED ORDER — AMLODIPINE BESYLATE 2.5 MG PO TABS: ORAL_TABLET | ORAL | 3 refills | Status: DC

## 2017-06-28 NOTE — Progress Notes (Signed)
BP 116/74   Pulse 90   Temp 98.4 F (36.9 C) (Oral)   Resp 12   Ht  (1.88 m)   Wt 186 lb 6.4 oz (84.6 kg)   SpO2 97%   BMI 23.93 kg/m    Subjective:    Patient ID: Jason Hale, male    DOB: [birth year], 27 y.o.   MRN: [id number]  HPI: Jason Hale is a 27 y.o. male  Chief Complaint  Patient presents with  . Follow-up    HPI Patient is here for f/u He had his esophagus stretched in late Feb, feels much better His BP was high at the time, maybe due to the surgery They recommended getting back on the medicine He eats well BP in the ER was 158/107 on 03/30/17, but stressed and food stuck High too during EGD and stretching He checks his BP at work sometimes at work [employer] fatigued a lot; even at work; sits down, not sure Family hx of HTN: mother and father Sounds situation upon review of numbers Not adding salt to his food Cooks most meals No decongestants Not exercising Still has vibration sensation around the perineum; nothing unusual down there; moving urine and bowels okay  Depression screen [facility] 2/9 06/28/2017 06/01/2016 05/02/2016 11/01/2015 04/05/2015  Decreased Interest 0 0 0 0 0  Down, Depressed, Hopeless 0 0 1 0 1  PHQ - 2 Score 0 0 1 0 1    Relevant past medical, surgical, family and social history reviewed Past Medical History:  Diagnosis Date  . Hypertension   . IBS (irritable bowel syndrome)    Past Surgical History:  Procedure Laterality Date  . APPENDECTOMY    . ESOPHAGOGASTRODUODENOSCOPY N/A 07/28/2014   Procedure: ESOPHAGOGASTRODUODENOSCOPY (EGD);  Surgeon: Dorena Cookey, MD;  Location: [facility] ENDOSCOPY;  Service: Endoscopy;  Laterality: N/A;  . esophagus stretched  04/05/2017  . FOREIGN BODY REMOVAL N/A 07/28/2014   Procedure: FOREIGN BODY REMOVAL;  Surgeon: Dorena Cookey, MD;  Location: [facility] ENDOSCOPY;  Service: Endoscopy;  Laterality: N/A;  . TONSILLECTOMY    . WISDOM TOOTH EXTRACTION     Family History  Problem Relation Age of Onset  . Hypertension  Mother   . Diabetes Mother   . Stroke Mother   . Heart attack Mother   . Asthma Son   . Heart disease Maternal Grandfather   . Heart attack Maternal Grandfather    Social History   Tobacco Use  . Smoking status: Current Some Day Smoker  . Smokeless tobacco: Never Used  Substance Use Topics  . Alcohol use: Yes    Comment: occasional  . Drug use: Yes    Types: Marijuana    Interim medical history since last visit reviewed. Allergies and medications reviewed  Review of Systems Per HPI unless specifically indicated above     Objective:    BP 116/74   Pulse 90   Temp 98.4 F (36.9 C) (Oral)   Resp 12   Ht  (1.88 m)   Wt 186 lb 6.4 oz (84.6 kg)   SpO2 97%   BMI 23.93 kg/m   Wt Readings from Last 3 Encounters:  06/28/17 186 lb 6.4 oz (84.6 kg)  03/29/17 190 lb (86.2 kg)  07/31/16 175 lb 14.4 oz (79.8 kg)    Physical Exam  Constitutional: He appears well-developed and well-nourished. No distress.  Eyes: No scleral icterus.  Cardiovascular: Normal rate and regular rhythm.  Pulmonary/Chest: Effort normal and breath sounds normal.  Neurological:  He is alert.  Skin: No pallor.  Psychiatric: He has a normal mood and affect.      Assessment & Plan:   Problem List Items Addressed This Visit      Cardiovascular and Mediastinum   Hypertension goal BP (blood pressure) < 140/90 - Primary    Discussed the pressures that he has exhibited at various situations; controlled when calm; up at ER, urgent care; will provide him with PRN CCB at low dose; discussed various factors that influence blood pressure; check renal function today      Relevant Medications   amLODipine (NORVASC) 2.5 MG tablet   Other Relevant Orders   COMPLETE METABOLIC PANEL WITH GFR (Completed)   TSH (Completed)   Microalbumin / creatinine urine ratio (Completed)     Digestive   Intermittent dysphagia    Under care of GI       Other Visit Diagnoses    Vibration sensory loss       abnormal  sensation reported by patient; no etiology; monitor and call back if bothersome/continues       Follow up plan: No follow-ups on file.  An after-visit summary was printed and given to the patient at check-out.  Please see the patient instructions which may contain other information and recommendations beyond what is mentioned above in the assessment and plan.  Meds ordered this encounter  Medications  . amLODipine (NORVASC) 2.5 MG tablet    Sig: One pill by mouth daily if needed for high blood pressure (over 140/90); may repeat in one hour if needed (two per 24 hours)    Dispense:  30 tablet    Refill:  3    Orders Placed This Encounter  Procedures  . COMPLETE METABOLIC PANEL WITH GFR  . TSH  . Microalbumin / creatinine urine ratio

## 2017-06-28 NOTE — Patient Instructions (Addendum)
Try to use PLAIN allergy medicine without the decongestant Avoid: phenylephrine, phenylpropanolamine, and pseudoephredine Try to follow the DASH guidelines (DASH stands for Dietary Approaches to Stop Hypertension). Try to limit the sodium in your diet to no more than 1,500mg of sodium per day. Certainly try to not exceed 2,000 mg per day at the very most. Do not add salt when cooking or at the table.  Check the sodium amount on labels when shopping, and choose items lower in sodium when given a choice. Avoid or limit foods that already contain a lot of sodium. Eat a diet rich in fruits and vegetables and whole grains, and try to lose weight if overweight or obese  DASH Eating Plan DASH stands for "Dietary Approaches to Stop Hypertension." The DASH eating plan is a healthy eating plan that has been shown to reduce high blood pressure (hypertension). It may also reduce your risk for type 2 diabetes, heart disease, and stroke. The DASH eating plan may also help with weight loss. What are tips for following this plan? General guidelines  Avoid eating more than 2,300 mg (milligrams) of salt (sodium) a day. If you have hypertension, you may need to reduce your sodium intake to 1,500 mg a day.  Limit alcohol intake to no more than 1 drink a day for nonpregnant women and 2 drinks a day for men. One drink equals 12 oz of beer, 5 oz of wine, or 1 oz of hard liquor.  Work with your health care provider to maintain a healthy body weight or to lose weight. Ask what an ideal weight is for you.  Get at least 30 minutes of exercise that causes your heart to beat faster (aerobic exercise) most days of the week. Activities may include walking, swimming, or biking.  Work with your health care provider or diet and nutrition specialist (dietitian) to adjust your eating plan to your individual calorie needs. Reading food labels  Check food labels for the amount of sodium per serving. Choose foods with less than 5  percent of the Daily Value of sodium. Generally, foods with less than 300 mg of sodium per serving fit into this eating plan.  To find whole grains, look for the word "whole" as the first word in the ingredient list. Shopping  Buy products labeled as "low-sodium" or "no salt added."  Buy fresh foods. Avoid canned foods and premade or frozen meals. Cooking  Avoid adding salt when cooking. Use salt-free seasonings or herbs instead of table salt or sea salt. Check with your health care provider or pharmacist before using salt substitutes.  Do not fry foods. Cook foods using healthy methods such as baking, boiling, grilling, and broiling instead.  Cook with heart-healthy oils, such as olive, canola, soybean, or sunflower oil. Meal planning   Eat a balanced diet that includes: ? 5 or more servings of fruits and vegetables each day. At each meal, try to fill half of your plate with fruits and vegetables. ? Up to 6-8 servings of whole grains each day. ? Less than 6 oz of lean meat, poultry, or fish each day. A 3-oz serving of meat is about the same size as a deck of cards. One egg equals 1 oz. ? 2 servings of low-fat dairy each day. ? A serving of nuts, seeds, or beans 5 times each week. ? Heart-healthy fats. Healthy fats called Omega-3 fatty acids are found in foods such as flaxseeds and coldwater fish, like sardines, salmon, and mackerel.  Limit how   much you eat of the following: ? Canned or prepackaged foods. ? Food that is high in trans fat, such as fried foods. ? Food that is high in saturated fat, such as fatty meat. ? Sweets, desserts, sugary drinks, and other foods with added sugar. ? Full-fat dairy products.  Do not salt foods before eating.  Try to eat at least 2 vegetarian meals each week.  Eat more home-cooked food and less restaurant, buffet, and fast food.  When eating at a restaurant, ask that your food be prepared with less salt or no salt, if possible. What foods are  recommended? The items listed may not be a complete list. Talk with your dietitian about what dietary choices are best for you. Grains Whole-grain or whole-wheat bread. Whole-grain or whole-wheat pasta. Brown rice. Oatmeal. Quinoa. Bulgur. Whole-grain and low-sodium cereals. Pita bread. Low-fat, low-sodium crackers. Whole-wheat flour tortillas. Vegetables Fresh or frozen vegetables (raw, steamed, roasted, or grilled). Low-sodium or reduced-sodium tomato and vegetable juice. Low-sodium or reduced-sodium tomato sauce and tomato paste. Low-sodium or reduced-sodium canned vegetables. Fruits All fresh, dried, or frozen fruit. Canned fruit in natural juice (without added sugar). Meat and other protein foods Skinless chicken or turkey. Ground chicken or turkey. Pork with fat trimmed off. Fish and seafood. Egg whites. Dried beans, peas, or lentils. Unsalted nuts, nut butters, and seeds. Unsalted canned beans. Lean cuts of beef with fat trimmed off. Low-sodium, lean deli meat. Dairy Low-fat (1%) or fat-free (skim) milk. Fat-free, low-fat, or reduced-fat cheeses. Nonfat, low-sodium ricotta or cottage cheese. Low-fat or nonfat yogurt. Low-fat, low-sodium cheese. Fats and oils Soft margarine without trans fats. Vegetable oil. Low-fat, reduced-fat, or light mayonnaise and salad dressings (reduced-sodium). Canola, safflower, olive, soybean, and sunflower oils. Avocado. Seasoning and other foods Herbs. Spices. Seasoning mixes without salt. Unsalted popcorn and pretzels. Fat-free sweets. What foods are not recommended? The items listed may not be a complete list. Talk with your dietitian about what dietary choices are best for you. Grains Baked goods made with fat, such as croissants, muffins, or some breads. Dry pasta or rice meal packs. Vegetables Creamed or fried vegetables. Vegetables in a cheese sauce. Regular canned vegetables (not low-sodium or reduced-sodium). Regular canned tomato sauce and paste (not  low-sodium or reduced-sodium). Regular tomato and vegetable juice (not low-sodium or reduced-sodium). Pickles. Olives. Fruits Canned fruit in a light or heavy syrup. Fried fruit. Fruit in cream or butter sauce. Meat and other protein foods Fatty cuts of meat. Ribs. Fried meat. Bacon. Sausage. Bologna and other processed lunch meats. Salami. Fatback. Hotdogs. Bratwurst. Salted nuts and seeds. Canned beans with added salt. Canned or smoked fish. Whole eggs or egg yolks. Chicken or turkey with skin. Dairy Whole or 2% milk, cream, and half-and-half. Whole or full-fat cream cheese. Whole-fat or sweetened yogurt. Full-fat cheese. Nondairy creamers. Whipped toppings. Processed cheese and cheese spreads. Fats and oils Butter. Stick margarine. Lard. Shortening. Ghee. Bacon fat. Tropical oils, such as coconut, palm kernel, or palm oil. Seasoning and other foods Salted popcorn and pretzels. Onion salt, garlic salt, seasoned salt, table salt, and sea salt. Worcestershire sauce. Tartar sauce. Barbecue sauce. Teriyaki sauce. Soy sauce, including reduced-sodium. Steak sauce. Canned and packaged gravies. Fish sauce. Oyster sauce. Cocktail sauce. Horseradish that you find on the shelf. Ketchup. Mustard. Meat flavorings and tenderizers. Bouillon cubes. Hot sauce and Tabasco sauce. Premade or packaged marinades. Premade or packaged taco seasonings. Relishes. Regular salad dressings. Where to find more information:  National Heart, Lung, and Blood Institute: www.nhlbi.nih.gov    American Heart Association: www.heart.org Summary  The DASH eating plan is a healthy eating plan that has been shown to reduce high blood pressure (hypertension). It may also reduce your risk for type 2 diabetes, heart disease, and stroke.  With the DASH eating plan, you should limit salt (sodium) intake to 2,300 mg a day. If you have hypertension, you may need to reduce your sodium intake to 1,500 mg a day.  When on the DASH eating plan,  aim to eat more fresh fruits and vegetables, whole grains, lean proteins, low-fat dairy, and heart-healthy fats.  Work with your health care provider or diet and nutrition specialist (dietitian) to adjust your eating plan to your individual calorie needs. This information is not intended to replace advice given to you by your health care provider. Make sure you discuss any questions you have with your health care provider. Document Released: 01/11/2011 Document Revised: 01/16/2016 Document Reviewed: 01/16/2016 Elsevier Interactive Patient Education  2018 Elsevier Inc.  

## 2017-06-29 LAB — COMPLETE METABOLIC PANEL WITH GFR
AG Ratio: 1.7 (calc) (ref 1.0–2.5)
ALT: 11 U/L (ref 9–46)
AST: 13 U/L (ref 10–40)
Albumin: 4.3 g/dL (ref 3.6–5.1)
Alkaline phosphatase (APISO): 51 U/L (ref 40–115)
BUN: 14 mg/dL (ref 7–25)
CO2: 28 mmol/L (ref 20–32)
CREATININE: 0.91 mg/dL (ref 0.60–1.35)
Calcium: 9.7 mg/dL (ref 8.6–10.3)
Chloride: 106 mmol/L (ref 98–110)
GFR, Est African American: 132 mL/min/{1.73_m2} (ref >=60)
GFR, Est Non African American: 114 mL/min/{1.73_m2} (ref >=60)
GLUCOSE: 86 mg/dL (ref 65–139)
Globulin: 2.6 g/dL (calc) (ref 1.9–3.7)
Potassium: 4 mmol/L (ref 3.5–5.3)
Sodium: 139 mmol/L (ref 135–146)
Total Bilirubin: 0.5 mg/dL (ref 0.2–1.2)
Total Protein: 6.9 g/dL (ref 6.1–8.1)

## 2017-06-29 LAB — MICROALBUMIN / CREATININE URINE RATIO
Creatinine, Urine: 187 mg/dL (ref 20–320)
Microalb Creat Ratio: 2 mcg/mg creat (ref <30)
Microalb, Ur: 0.3 mg/dL

## 2017-06-29 LAB — TSH: TSH: 1.7 m[IU]/L (ref 0.40–4.50)

## 2017-07-03 ENCOUNTER — Encounter: Payer: Self-pay | Admitting: Family Medicine

## 2017-07-03 NOTE — Assessment & Plan Note (Signed)
Under care of GI

## 2017-07-03 NOTE — Assessment & Plan Note (Signed)
Discussed the pressures that he has exhibited at various situations; controlled when calm; up at ER, urgent care; will provide him with PRN CCB at low dose; discussed various factors that influence blood pressure; check renal function today

## 2017-11-25 ENCOUNTER — Encounter: Payer: Self-pay | Admitting: Nurse Practitioner

## 2017-11-25 ENCOUNTER — Telehealth: Payer: Self-pay | Admitting: Family Medicine

## 2017-11-25 ENCOUNTER — Other Ambulatory Visit (HOSPITAL_COMMUNITY)
Admission: RE | Admit: 2017-11-25 | Discharge: 2017-11-25 | Disposition: A | Discharge status: Home / Self Care | Payer: 59 | Source: Ambulatory Visit | Attending: Nurse Practitioner | Admitting: Nurse Practitioner

## 2017-11-25 ENCOUNTER — Ambulatory Visit (INDEPENDENT_AMBULATORY_CARE_PROVIDER_SITE_OTHER): Payer: Self-pay | Admitting: Nurse Practitioner

## 2017-11-25 VITALS — BP 110/80 | HR 90 | Temp 97.9°F | Resp 12 | Ht 74.0 in | Wt 184.7 lb

## 2017-11-25 DIAGNOSIS — Z113 Encounter for screening for infections with a predominantly sexual mode of transmission: Secondary | ICD-10-CM | POA: Insufficient documentation

## 2017-11-25 DIAGNOSIS — H00011 Hordeolum externum right upper eyelid: Secondary | ICD-10-CM

## 2017-11-25 DIAGNOSIS — L299 Pruritus, unspecified: Secondary | ICD-10-CM

## 2017-11-25 MED ORDER — BACITRACIN-POLYMYXIN B 500-10000 UNIT/GM OP OINT: 1.0000 "application " | TOPICAL_OINTMENT | Freq: Two times a day (BID) | OPHTHALMIC | 0 refills | Status: AC

## 2017-11-25 MED ORDER — CAMPHOR-MENTHOL 0.5-0.5 % EX LOTN: 1.0000 "application " | TOPICAL_LOTION | CUTANEOUS | 0 refills | Status: DC | PRN

## 2017-11-25 NOTE — Telephone Encounter (Signed)
Copied from CRM [id number]. Topic: General - Inquiry >> Nov 25, 2017  2:36 PM Windy Kalata, NT wrote: Reason for CRM: Jason Hale is calling from [facility] and states that they received a urine specimen with paperwork but the specimen did not have the patient name on it so they can not accept it. This was sent in by Jason Hale and she is requesting a call back from her or her nurse.  Cb# [phone]

## 2017-11-25 NOTE — Patient Instructions (Addendum)
If you have not heard anything from my staff in a week about any orders/referrals/studies from today, please contact us here to follow-up [phone] ______________________  For your itching: I sent a cream to [facility] pharmacy that you can apply to help with your symptoms. If you do develop a rash or worsening itching please call or message [place].   For your eye:  It does appear to be improving so I would recommend the following:  - Warm compresses for 5-10 minutes twice daily  - Eyelid scrubs: warm moist washcloth  Wit diluted baby shampoo cleanse lid twice a day  - Omega-3-acid ethyl esters 2000 mg by mouth three times day  - artificial tears twice daily; to affected eye twice a day and additionally if needed.   I am sending an antibiotic ointment for your eye that you can pick up If you noticed yellow discharge, worsening pain, redness or swelling please call and let us know/ - If having eye pain or vision changes please go to eye doctor

## 2017-11-25 NOTE — Progress Notes (Signed)
Name: Jason Hale   MRN: [id number]    DOB: Feb 04, 1991   Date:11/25/2017       Progress Note  Subjective  Chief Complaint  Chief Complaint  Patient presents with  . Genital itching  . Stye    infection on right eye lid. 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    HPI  Stye right eye intermittent, started this last week, using warm compresses here and there but not daily. Has started to improve some. States has had these in the past, self resolve, one time needed antibiotic. No vision changes but notes blocked vision for stye.   Thinks he has jock itch- states no rash just an itchy at the head of penis and no scrotum. Denies drainage; states was using anti-fungal cream. sexually active with girlfriend. Does note has been treated for an std before. No drainage.     Patient Active Problem List   Diagnosis Date Noted  . Dyspepsia 05/16/2016  . Bronchospasm 11/01/2015  . Hypertension goal BP (blood pressure) < 140/90 04/05/2015  . Intermittent dysphagia 04/05/2015    Past Medical History:  Diagnosis Date  . Hypertension   . IBS (irritable bowel syndrome)     Past Surgical History:  Procedure Laterality Date  . APPENDECTOMY    . ESOPHAGOGASTRODUODENOSCOPY N/A 07/28/2014   Procedure: ESOPHAGOGASTRODUODENOSCOPY (EGD);  Surgeon: Dorena Cookey, MD;  Location: [facility] ENDOSCOPY;  Service: Endoscopy;  Laterality: N/A;  . esophagus stretched  04/05/2017  . FOREIGN BODY REMOVAL N/A 07/28/2014   Procedure: FOREIGN BODY REMOVAL;  Surgeon: Dorena Cookey, MD;  Location: [facility] ENDOSCOPY;  Service: Endoscopy;  Laterality: N/A;  . TONSILLECTOMY    . WISDOM TOOTH EXTRACTION      Social History   Tobacco Use  . Smoking status: Current Some Day Smoker  . Smokeless tobacco: Never Used  Substance Use Topics  . Alcohol use: Yes    Comment: occasional     Current Outpatient Medications:  .  albuterol (PROVENTIL HFA;VENTOLIN HFA) 108 (90 Base)  MCG/ACT inhaler, Inhale 2 puffs into the lungs every 4 (four) hours as needed for wheezing or shortness of breath., Disp: 1 Inhaler, Rfl: 1 .  amLODipine (NORVASC) 2.5 MG tablet, One pill by mouth daily if needed for high blood pressure (over 140/90); may repeat in one hour if needed (two per 24 hours), Disp: 30 tablet, Rfl: 3 .  ranitidine (ZANTAC) 150 MG/10ML syrup, Take 10 mLs (150 mg total) by mouth 2 (two) times daily. If needed, Disp: 300 mL, Rfl: 2  No Known Allergies  ROS    No other specific complaints in a complete review of systems (except as listed in HPI above).  Objective  Vitals:   11/25/17 0901  BP: 110/80  Pulse: 90  Resp: 12  Temp: 97.9 F (36.6 C)  TempSrc: Oral  SpO2: 99%  Weight: 184 lb 11.2 oz (83.8 kg)  Height: 6\' 2"  (1.88 m)    Body mass index is 23.71 kg/m.  Nursing Note and Vital Signs reviewed.  Physical Exam  Constitutional: He is oriented to person, place, and time. He appears well-developed and well-nourished.  HENT:  Head: Normocephalic and atraumatic.  Eyes: Pupils are equal, round, and reactive to light. Conjunctivae and EOM are normal. Right eye exhibits hordeolum. Right eye exhibits no discharge. Left eye exhibits no discharge.  Cardiovascular: Normal rate.  Pulmonary/Chest: Effort normal and breath sounds normal.  Genitourinary: Penis normal.  Neurological: He  is alert and oriented to person, place, and time. Coordination normal.  Skin: Skin is warm and dry. He is not diaphoretic. No erythema.  Psychiatric: He has a normal mood and affect. His behavior is normal. Judgment and thought content normal.       No results found for this or any previous visit (from the past 48 hour(s)).  Assessment & Plan  1. Pruritus No rash present; keep area clean and dry, if rash presents return.  - camphor-menthol (SARNA) lotion; Apply 1 application topically as needed for itching.  Dispense: 222 mL; Refill: 0  2. Screening for STD (sexually  transmitted disease) - Urine cytology ancillary only - HIV antibody (with reflex) - RPR - Hepatitis panel, acute  3. Hordeolum externum of right upper eyelid It does appear to be improving so I would recommend the following:  - Warm compresses for 5-10 minutes twice daily  - Eyelid scrubs: warm moist washcloth  Wit diluted baby shampoo cleanse lid twice a day  - Omega-3-acid ethyl esters 2000 mg by mouth three times day  - artificial tears twice daily; to affected eye twice a day and additionally if needed.   I am sending an antibiotic ointment for your eye that you can pick up If you noticed yellow discharge, worsening pain, redness or swelling please call and let us know/ - If having eye pain or vision changes please go to eye doctor  - bacitracin-polymyxin b (POLYSPORIN) ophthalmic ointment; Place 1 application into the right eye every 12 (twelve) hours for 5 days. apply to eye every 12 hours while awake/  Dispense: 3.5 g; Refill: 0

## 2017-11-25 NOTE — Telephone Encounter (Signed)
Sent out another specimen.

## 2017-11-26 ENCOUNTER — Telehealth: Payer: Self-pay | Admitting: Family Medicine

## 2017-11-26 ENCOUNTER — Other Ambulatory Visit: Payer: Self-pay | Admitting: Nurse Practitioner

## 2017-11-26 DIAGNOSIS — L299 Pruritus, unspecified: Secondary | ICD-10-CM

## 2017-11-26 MED ORDER — CAMPHOR-MENTHOL 0.5-0.5 % EX LOTN: 1.0000 "application " | TOPICAL_LOTION | CUTANEOUS | 0 refills | Status: DC | PRN

## 2017-11-26 NOTE — Telephone Encounter (Signed)
Copied from CRM [id number]. Topic: Quick Communication - See Telephone Encounter >> Nov 26, 2017  7:57 AM Windy Kalata, NT wrote: CRM for notification. See Telephone encounter for: 11/26/17.  Patient is calling and states the pharmacy did not receive camphor-menthol (SARNA) lotion they only received the prescription for his eye. He would like this to be resent.  [facility] - [place], [place] - [address] [place] [address] Phone: [phone] Fax: [phone]

## 2017-11-26 NOTE — Telephone Encounter (Signed)
Done

## 2017-11-26 NOTE — Telephone Encounter (Signed)
States order for sarna was confirmed via EPIC E-Prescribing Status: Receipt confirmed by pharmacy (11/25/2017 9:34 AM EDT)  _________ Order resent; please contact pharmacy to ensure there is no further delay

## 2017-11-27 LAB — URINE CYTOLOGY ANCILLARY ONLY
CHLAMYDIA, DNA PROBE: NEGATIVE
Neisseria Gonorrhea: NEGATIVE
Trichomonas: NEGATIVE

## 2018-04-16 IMAGING — CR DG NECK SOFT TISSUE
2 series · 2 of 2 positions shown · non-contrast
Comparison: None.

CLINICAL DATA: Globus sensation. Patient feels he has a piece of
chicken or Lcy Ba in his throat and and has been unable to drink
or eat anything since [DATE] p.m. last evening.

EXAM:
NECK SOFT TISSUES - 1+ VIEW

[neck lat]
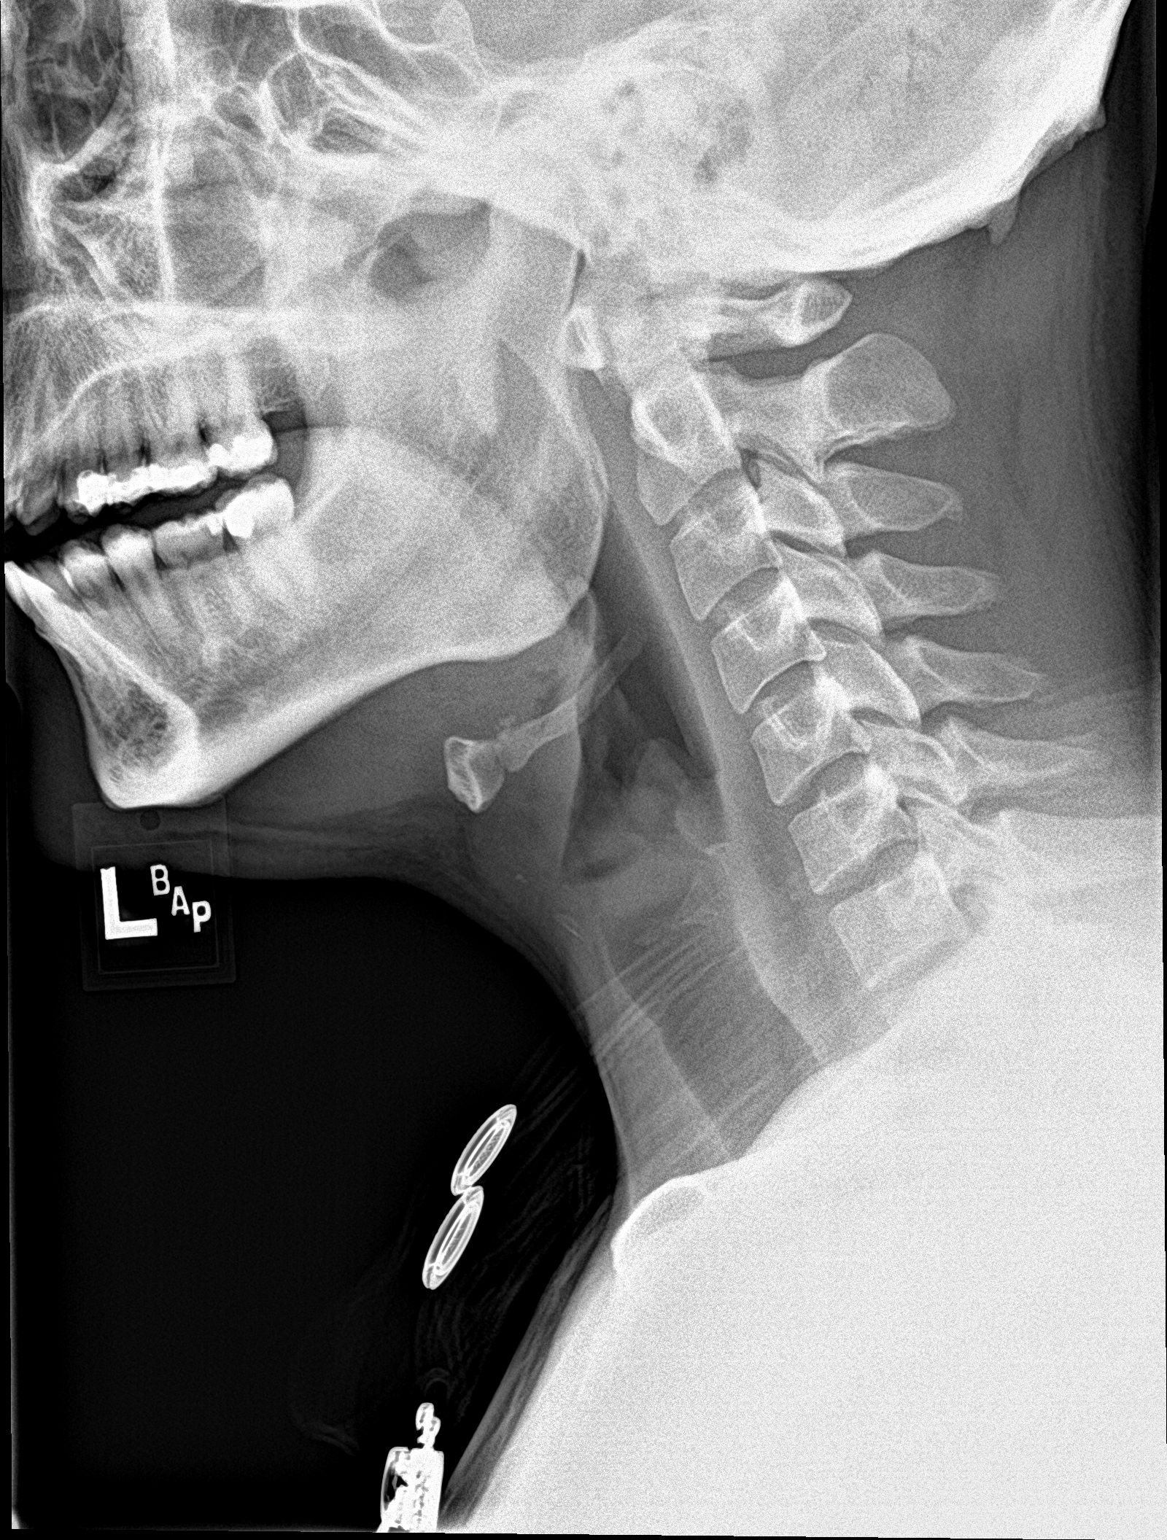

[neck ap]
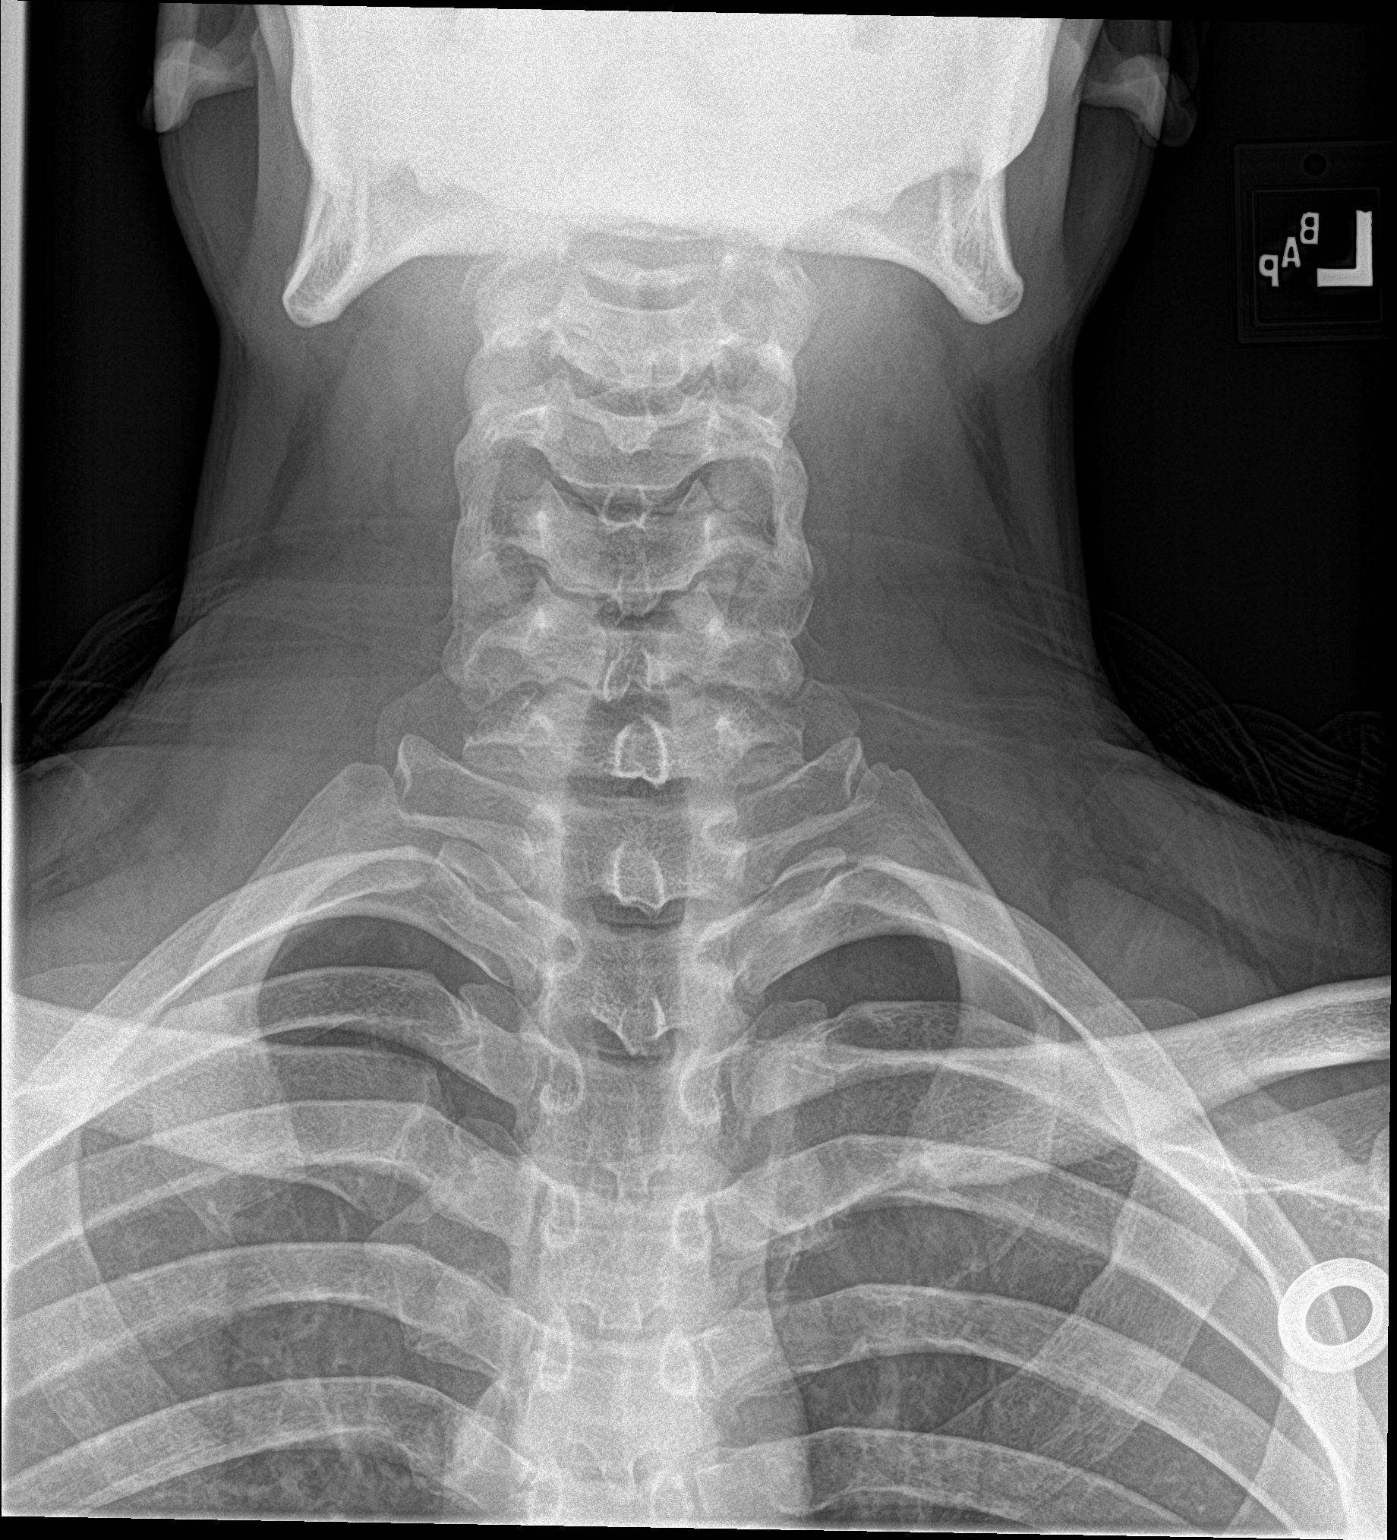

[2 of 2 positions shown; findings below may reference images not displayed]

FINDINGS: There is no evidence of retropharyngeal soft tissue swelling or
epiglottic enlargement. The cervical airway is unremarkable. There
is slight soft tissue prominence along the expected location of the
aryepiglottic folds that could potentially represent some
inflammatory thickening. The possibility of a food bolus is not
entirely excluded but believed less likely. CT of the neck may help
from an imaging standpoint.
IMPRESSION: No retropharyngeal soft tissue swelling. No radiopaque foreign body
is identified. Slight soft tissue fullness in the region of the area
epiglottic folds which could represent a mild inflammatory
thickening. Soft tissue food bolus is not entirely excluded but
believed less likely.

## 2018-09-22 ENCOUNTER — Other Ambulatory Visit: Payer: Self-pay

## 2018-09-22 ENCOUNTER — Emergency Department (HOSPITAL_COMMUNITY)
Admission: EM | Admit: 2018-09-22 | Discharge: 2018-09-22 | Disposition: A | Discharge status: Home / Self Care | Payer: Self-pay | Attending: Emergency Medicine | Admitting: Emergency Medicine

## 2018-09-22 ENCOUNTER — Encounter (HOSPITAL_COMMUNITY): Payer: Self-pay

## 2018-09-22 DIAGNOSIS — F17200 Nicotine dependence, unspecified, uncomplicated: Secondary | ICD-10-CM | POA: Insufficient documentation

## 2018-09-22 DIAGNOSIS — K0889 Other specified disorders of teeth and supporting structures: Secondary | ICD-10-CM | POA: Insufficient documentation

## 2018-09-22 DIAGNOSIS — I10 Essential (primary) hypertension: Secondary | ICD-10-CM | POA: Insufficient documentation

## 2018-09-22 MED ORDER — PENICILLIN V POTASSIUM 500 MG PO TABS: 500.0000 mg | ORAL_TABLET | Freq: Four times a day (QID) | ORAL | 0 refills | Status: DC

## 2018-09-22 MED ORDER — PENICILLIN V POTASSIUM 250 MG PO TABS
500.0000 mg | ORAL_TABLET | Freq: Once | ORAL | Status: AC
  Administered 2018-09-22: 500 mg via ORAL
  Filled 2018-09-22: qty 2

## 2018-09-22 MED ORDER — KETOROLAC TROMETHAMINE 30 MG/ML IJ SOLN
30.0000 mg | Freq: Once | INTRAMUSCULAR | Status: AC
  Administered 2018-09-22: 30 mg via INTRAMUSCULAR
  Filled 2018-09-22: qty 1

## 2018-09-22 NOTE — ED Notes (Signed)
Patient verbalizes understanding of discharge instructions. Opportunity for questioning and answers were provided. Armband removed by staff, pt discharged from ED.  

## 2018-09-22 NOTE — ED Triage Notes (Signed)
Pt states that he has been having L upper dental pain for the past several days unrelieved by OTC meds.

## 2018-09-22 NOTE — ED Notes (Signed)
Swelling noted to left, upper gum line. Complaints of tooth and gum pain.

## 2018-09-22 NOTE — ED Provider Notes (Signed)
[facility]   CSN: [id number] Arrival date & time: 09/22/18  2051     History   Chief Complaint Chief Complaint  Patient presents with  . Dental Pain    HPI Jason Hale is a 28 y.o. male.  Patient presents to the emergency department with a dental complaint. Symptoms began yesterday and have worsened. The patient has tried to alleviate pain with ibuprofen, aleve, and vicodin.  Pain rated as severe, characterized as throbbing in nature and located left upper rear molar. Patient denies fever, night sweats, chills, difficulty swallowing or opening mouth, SOB, nuchal rigidity or decreased ROM of neck.  Patient does not have a dentist and requests a resource guide at discharge.      HPI  Past Medical History:  Diagnosis Date  . Hypertension   . IBS (irritable bowel syndrome)     Patient Active Problem List   Diagnosis Date Noted  . Dyspepsia 05/16/2016  . Bronchospasm 11/01/2015  . Hypertension goal BP (blood pressure) < 140/90 04/05/2015  . Intermittent dysphagia 04/05/2015    Past Surgical History:  Procedure Laterality Date  . APPENDECTOMY    . ESOPHAGOGASTRODUODENOSCOPY N/A 07/28/2014   Procedure: ESOPHAGOGASTRODUODENOSCOPY (EGD);  Surgeon: Dorena CookeyJohn Hayes, MD;  [facility];  Service: Endoscopy;  Laterality: N/A;  . esophagus stretched  04/05/2017  . FOREIGN BODY REMOVAL N/A 07/28/2014   Procedure: FOREIGN BODY REMOVAL;  Surgeon: Dorena CookeyJohn Hayes, MD;  [facility];  Service: Endoscopy;  Laterality: N/A;  . TONSILLECTOMY    . WISDOM TOOTH EXTRACTION          Home Medications    Prior to Admission medications   Medication Sig Start Date End Date Taking? Authorizing Provider  albuterol (PROVENTIL HFA;VENTOLIN HFA) 108 (90 Base) MCG/ACT inhaler Inhale 2 puffs into the lungs every 4 (four) hours as needed for wheezing or shortness of breath. 11/01/15   Kerman PasseyLada, Melinda P, MD  amLODipine (NORVASC) 2.5 MG  tablet One pill by mouth daily if needed for high blood pressure (over 140/90); may repeat in one hour if needed (two per 24 hours) 06/28/17   Lada, Janit BernMelinda P, MD  camphor-menthol [facility](SARNA) lotion Apply 1 application topically as needed for itching. 11/26/17   Poulose, Percell BeltElizabeth E, NP  penicillin v potassium (VEETID) 500 MG tablet Take 1 tablet (500 mg total) by mouth 4 (four) times daily. 09/22/18   Roxy HorsemanBrowning, Careen Mauch, PA-C  ranitidine (ZANTAC) 150 MG/10ML syrup Take 10 mLs (150 mg total) by mouth 2 (two) times daily. If needed 11/01/15   Kerman PasseyLada, Melinda P, MD    Family History Family History  Problem Relation Age of Onset  . Hypertension Mother   . Diabetes Mother   . Stroke Mother   . Heart attack Mother   . Asthma Son   . Heart disease Maternal Grandfather   . Heart attack Maternal Grandfather     Social History Social History   Tobacco Use  . Smoking status: Current Some Day Smoker  . Smokeless tobacco: Never Used  Substance Use Topics  . Alcohol use: Yes    Comment: occasional  . Drug use: Yes    Types: Marijuana     Allergies   Patient has no known allergies.   Review of Systems Review of Systems  Constitutional: Negative for chills and fever.  HENT: Positive for dental problem. Negative for drooling.   Neurological: Negative for speech difficulty.  Psychiatric/Behavioral: Positive for sleep disturbance.  Physical Exam Updated Vital Signs BP (!) 143/87   Pulse 62   Temp 98.1 F (36.7 C) (Oral)   Resp 20   SpO2 98%   Physical Exam Physical Exam  Constitutional: Pt appears well-developed and well-nourished.  HENT:  Head: Normocephalic.  Right Ear: Tympanic membrane, external ear and ear canal normal.  Left Ear: Tympanic membrane, external ear and ear canal normal.  Nose: Nose normal. Right sinus exhibits no maxillary sinus tenderness and no frontal sinus tenderness. Left sinus exhibits no maxillary sinus tenderness and no frontal sinus tenderness.   Mouth/Throat: Uvula is midline, oropharynx is clear and moist and mucous membranes are normal. No oral lesions. No uvula swelling or lacerations. No oropharyngeal exudate, posterior oropharyngeal edema, posterior oropharyngeal erythema or tonsillar abscesses.  Poor dentition No gingival swelling, fluctuance or induration No gross abscess  No sublingual edema, tenderness to palpation, or sign of Ludwig's angina, or deep space infection Pain at left upper rear molar Eyes: Conjunctivae are normal. Pupils are equal, round, and reactive to light. Right eye exhibits no discharge. Left eye exhibits no discharge.  Neck: Normal range of motion. Neck supple.  No stridor Handling secretions without difficulty No nuchal rigidity No cervical lymphadenopathy Cardiovascular: Normal rate, regular rhythm and normal heart sounds.   Pulmonary/Chest: Effort normal. No respiratory distress.  Equal chest rise  Abdominal: Soft. Bowel sounds are normal. Pt exhibits no distension. There is no tenderness.  Lymphadenopathy: Pt has no cervical adenopathy.  Neurological: Pt is alert and oriented x 4  Skin: Skin is warm and dry.  Psychiatric: Pt has a normal mood and affect.  Nursing note and vitals reviewed.   ED Treatments / Results  Labs (all labs ordered are listed, but only abnormal results are displayed) Labs Reviewed - No data to display  EKG    Radiology No results found.  Procedures Dental Block  Date/Time: 09/22/2018 10:25 PM Performed by: Montine Circle, PA-C Authorized by: Montine Circle, PA-C   Consent:    Consent obtained:  Verbal   Consent given by:  Patient   Risks discussed:  Infection, pain and unsuccessful block   Alternatives discussed:  No treatment Indications:    Indications: dental pain   Location:    Block type:  Posterior superior alveolar   Laterality:  Left Procedure details (see MAR for exact dosages):    Topical anesthetic:  Benzocaine gel   Needle gauge:  24  G   Anesthetic injected:  Bupivacaine 0.25% WITH epi   Injection procedure:  Anatomic landmarks identified, introduced needle and incremental injection Post-procedure details:    Outcome:  Pain improved   (including critical care time)  Medications Ordered in ED Medications  ketorolac (TORADOL) 30 MG/ML injection 30 mg (has no administration in time range)  penicillin v potassium (VEETID) tablet 500 mg (has no administration in time range)     Initial Impression / Assessment and Plan / ED Course  I have reviewed the triage vital signs and the nursing notes.  Pertinent labs & imaging results that were available during my care of the patient were reviewed by me and considered in my medical decision making (see chart for details).        Patient with dentalgia.  No abscess requiring immediate incision and drainage.  Exam not concerning for Ludwig's angina or pharyngeal abscess.  Will treat with penicillin. Pt instructed to follow-up with dentist.  Discussed return precautions. Pt safe for discharge.   Final Clinical Impressions(s) / ED Diagnoses  Final diagnoses:  Pain, dental    ED Discharge Orders         Ordered    penicillin v potassium (VEETID) 500 MG tablet  4 times daily     09/22/18 2221           Roxy HorsemanBrowning, Nesta Kimple, PA-C 09/22/18 2226    Maia PlanLong, Joshua G, MD 09/23/18 1004

## 2018-10-24 ENCOUNTER — Emergency Department (HOSPITAL_COMMUNITY)
Admission: EM | Admit: 2018-10-24 | Discharge: 2018-10-24 | Disposition: A | Discharge status: Home / Self Care | Payer: Self-pay | Attending: Emergency Medicine | Admitting: Emergency Medicine

## 2018-10-24 ENCOUNTER — Encounter (HOSPITAL_COMMUNITY): Payer: Self-pay | Admitting: Emergency Medicine

## 2018-10-24 DIAGNOSIS — S61210S Laceration without foreign body of right index finger without damage to nail, sequela: Secondary | ICD-10-CM

## 2018-10-24 DIAGNOSIS — Y999 Unspecified external cause status: Secondary | ICD-10-CM | POA: Insufficient documentation

## 2018-10-24 DIAGNOSIS — Y9289 Other specified places as the place of occurrence of the external cause: Secondary | ICD-10-CM | POA: Insufficient documentation

## 2018-10-24 DIAGNOSIS — Z79899 Other long term (current) drug therapy: Secondary | ICD-10-CM | POA: Insufficient documentation

## 2018-10-24 DIAGNOSIS — F17200 Nicotine dependence, unspecified, uncomplicated: Secondary | ICD-10-CM | POA: Insufficient documentation

## 2018-10-24 DIAGNOSIS — W25XXXA Contact with sharp glass, initial encounter: Secondary | ICD-10-CM | POA: Insufficient documentation

## 2018-10-24 DIAGNOSIS — I10 Essential (primary) hypertension: Secondary | ICD-10-CM | POA: Insufficient documentation

## 2018-10-24 DIAGNOSIS — S61210A Laceration without foreign body of right index finger without damage to nail, initial encounter: Secondary | ICD-10-CM | POA: Insufficient documentation

## 2018-10-24 DIAGNOSIS — Y93G1 Activity, food preparation and clean up: Secondary | ICD-10-CM | POA: Insufficient documentation

## 2018-10-24 MED ORDER — CLINDAMYCIN HCL 150 MG PO CAPS: 450.0000 mg | ORAL_CAPSULE | Freq: Three times a day (TID) | ORAL | 0 refills | Status: DC

## 2018-10-24 MED ORDER — CLINDAMYCIN HCL 150 MG PO CAPS: 450.0000 mg | ORAL_CAPSULE | Freq: Three times a day (TID) | ORAL | 0 refills | Status: AC

## 2018-10-24 NOTE — ED Provider Notes (Signed)
[facility] EMERGENCY DEPARTMENT Provider Note   CSN: [id number] Arrival date & time: 10/24/18  1603     History   Chief Complaint Chief Complaint  Patient presents with  . Finger Injury    HPI Jason Hale is a 28 y.o. male.     28 y.o male with a PMH of HTN, IBS presents to the ED with a chief complaint of right index finger laceration. Patient reports he was washing dishes 3-4 days ago when he cut his finger with one of the dishes. He reports bleeding was controlled with pressure, he then cleanse the wound with hydrogen peroxide. He has been applying neosporin but noted today clear drainage from the wound. He denies any tingling, pain with movement or fevers.   The history is provided by the patient.    Past Medical History:  Diagnosis Date  . Hypertension   . IBS (irritable bowel syndrome)     Patient Active Problem List   Diagnosis Date Noted  . Dyspepsia 05/16/2016  . Bronchospasm 11/01/2015  . Hypertension goal BP (blood pressure) < 140/90 04/05/2015  . Intermittent dysphagia 04/05/2015    Past Surgical History:  Procedure Laterality Date  . APPENDECTOMY    . ESOPHAGOGASTRODUODENOSCOPY N/A 07/28/2014   Procedure: ESOPHAGOGASTRODUODENOSCOPY (EGD);  Surgeon: Dorena Cookey, MD;  Location: [facility] ENDOSCOPY;  Service: Endoscopy;  Laterality: N/A;  . esophagus stretched  04/05/2017  . FOREIGN BODY REMOVAL N/A 07/28/2014   Procedure: FOREIGN BODY REMOVAL;  Surgeon: Dorena Cookey, MD;  Location: [facility] ENDOSCOPY;  Service: Endoscopy;  Laterality: N/A;  . TONSILLECTOMY    . WISDOM TOOTH EXTRACTION          Home Medications    Prior to Admission medications   Medication Sig Start Date End Date Taking? Authorizing Provider  albuterol (PROVENTIL HFA;VENTOLIN HFA) 108 (90 Base) MCG/ACT inhaler Inhale 2 puffs into the lungs every 4 (four) hours as needed for wheezing or shortness of breath. 11/01/15   Kerman Passey, MD  amLODipine (NORVASC) 2.5 MG tablet One  pill by mouth daily if needed for high blood pressure (over 140/90); may repeat in one hour if needed (two per 24 hours) 06/28/17   Lada, Janit Bern, MD  camphor-menthol [facility]) lotion Apply 1 application topically as needed for itching. 11/26/17   Poulose, Percell Belt, NP  clindamycin (CLEOCIN) 150 MG capsule Take 3 capsules (450 mg total) by mouth 3 (three) times daily for 7 days. 10/24/18 10/31/18  Claude Manges, PA-C  penicillin v potassium (VEETID) 500 MG tablet Take 1 tablet (500 mg total) by mouth 4 (four) times daily. 09/22/18   Roxy Horseman, PA-C  ranitidine (ZANTAC) 150 MG/10ML syrup Take 10 mLs (150 mg total) by mouth 2 (two) times daily. If needed 11/01/15   Kerman Passey, MD    Family History Family History  Problem Relation Age of Onset  . Hypertension Mother   . Diabetes Mother   . Stroke Mother   . Heart attack Mother   . Asthma Son   . Heart disease Maternal Grandfather   . Heart attack Maternal Grandfather     Social History Social History   Tobacco Use  . Smoking status: Current Some Day Smoker  . Smokeless tobacco: Never Used  Substance Use Topics  . Alcohol use: Yes    Comment: occasional  . Drug use: Yes    Types: Marijuana     Allergies   Patient has no known allergies.   Review of Systems  Review of Systems  Constitutional: Negative for fever.  Respiratory: Negative for shortness of breath.   Cardiovascular: Negative for chest pain.  Skin: Positive for wound (right index finger.).     Physical Exam Updated Vital Signs BP (!) 154/95 (BP Location: Right Arm)   Pulse 76   Temp 98.5 F (36.9 C)   Resp 17   Ht 6\' 1"  (1.854 m)   Wt 90.7 kg   SpO2 99%   BMI 26.39 kg/m   Physical Exam Vitals signs and nursing note reviewed.  Constitutional:      Appearance: Normal appearance.  HENT:     Head: Normocephalic and atraumatic.     Nose: No congestion or rhinorrhea.     Mouth/Throat:     Mouth: Mucous membranes are dry.  Eyes:     Pupils:  Pupils are equal, round, and reactive to light.  Neck:     Musculoskeletal: Normal range of motion and neck supple.  Cardiovascular:     Rate and Rhythm: Normal rate.  Pulmonary:     Effort: Pulmonary effort is normal.  Abdominal:     General: Abdomen is flat.  Musculoskeletal:     Right hand: He exhibits tenderness and laceration. He exhibits normal range of motion, no bony tenderness, normal two-point discrimination, normal capillary refill, no deformity and no swelling. Normal sensation noted. Decreased sensation is not present in the ulnar distribution, is not present in the medial distribution and is not present in the radial distribution. Normal strength noted. He exhibits no finger abduction, no thumb/finger opposition and no wrist extension trouble.       Hands:     Comments: Please see photos.   Neurological:     Mental Status: He is alert and oriented to person, place, and time.          ED Treatments / Results  Labs (all labs ordered are listed, but only abnormal results are displayed) Labs Reviewed - No data to display  EKG None  Radiology No results found.  Procedures Procedures (including critical care time)  Medications Ordered in ED Medications - No data to display   Initial Impression / Assessment and Plan / ED Course  I have reviewed the triage vital signs and the nursing notes.  Pertinent labs & imaging results that were available during my care of the patient were reviewed by me and considered in my medical decision making (see chart for details).        Patient with no PMH presents to the ED s/p right index finger injury approx. 3-4 days ago. He reports cleansing the wound with peroxide. Wound appears with surrounding erythema, worsening drainage, suspicion for beginning of infection. He denies any history of diabetes.No fever, no tingling, no weakness.   Last tetanus approx. 5 years ago.  Will place patient on abx with MRSA coverage,  clindamycin. He will follow up with hand specialist as needed. Patient understands and agrees with management. Return precautions discussed at length.     Portions of this note were generated with Lobbyist. Dictation errors may occur despite best attempts at proofreading.   Final Clinical Impressions(s) / ED Diagnoses   Final diagnoses:  Laceration of right index finger without damage to nail, foreign body presence unspecified, sequela    ED Discharge Orders         Ordered    clindamycin (CLEOCIN) 150 MG capsule  3 times daily     10/24/18 1811  Claude MangesSoto, Alvina Strother, PA-C 10/24/18 1815    Tegeler, Canary Brimhristopher J, MD 10/25/18 [phone]

## 2018-10-24 NOTE — ED Triage Notes (Signed)
Pt. Stated, I cut my hand/finger on Wednesday rt. Index finger , I put some new skin on it, and I need to check it out.

## 2018-10-24 NOTE — ED Notes (Addendum)
See provider note for assessment. Patient stated he has to leave now.

## 2018-10-24 NOTE — Discharge Instructions (Addendum)
I have prescribed antibiotics to help treat your likely infection, please take 3 tablets three times a day for the next 7 days.  Dr. Grandville Silos, hand specialist number is attached to your chart.  If you experience any redness, drainage or worsening symptoms please return to the ED.

## 2018-11-26 ENCOUNTER — Ambulatory Visit (INDEPENDENT_AMBULATORY_CARE_PROVIDER_SITE_OTHER): Payer: Self-pay | Admitting: Family Medicine

## 2018-11-26 ENCOUNTER — Other Ambulatory Visit: Payer: Self-pay

## 2018-11-26 ENCOUNTER — Encounter: Payer: Self-pay | Admitting: Family Medicine

## 2018-11-26 DIAGNOSIS — Z113 Encounter for screening for infections with a predominantly sexual mode of transmission: Secondary | ICD-10-CM

## 2018-11-26 DIAGNOSIS — Z20828 Contact with and (suspected) exposure to other viral communicable diseases: Secondary | ICD-10-CM

## 2018-11-26 NOTE — Progress Notes (Signed)
Name: Jason Hale   MRN: [id number]    DOB: 16-Jan-1991   Date:11/26/2018       Progress Note  Subjective  Chief Complaint  Chief Complaint  Patient presents with  . Exposure to STD    friend tested positive for herpes    I connected with  Jason Hale  on 11/26/18 at  9:20 AM EDT by a video enabled telemedicine application and verified that I am speaking with the correct person using two identifiers.  I discussed the limitations of evaluation and management by telemedicine and the availability of in person appointments. The patient expressed understanding and agreed to proceed. Staff also discussed with the patient that there may be a patient responsible charge related to this service. 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  He had unprotected oral intercourse with a male partner - notes the male partner called to report that she was diagnosed with herpes. He states that he has not noticed any oral lesions, no penile lesions.  He has a new partner now, he has let his new partner know that he had this potential exposure.  He would like full STI screening today.  We did discuss the blood work for HSV can be difficult to interpret and that I prefer to swab active lesions should one occur.  He verbalizes understanding regarding HSV blood work and chooses to proceed with plasma testing.  Patient Active Problem List   Diagnosis Date Noted  . Dyspepsia 05/16/2016  . Bronchospasm 11/01/2015  . Hypertension goal BP (blood pressure) < 140/90 04/05/2015  . Intermittent dysphagia 04/05/2015    Social History   Tobacco Use  . Smoking status: Current Some Day Smoker  . Smokeless tobacco: Never Used  Substance Use Topics  . Alcohol use: Yes    Comment: occasional     Current Outpatient Medications:  .  albuterol (PROVENTIL HFA;VENTOLIN HFA) 108 (90 Base) MCG/ACT  inhaler, Inhale 2 puffs into the lungs every 4 (four) hours as needed for wheezing or shortness of breath., Disp: 1 Inhaler, Rfl: 1 .  amLODipine (NORVASC) 2.5 MG tablet, One pill by mouth daily if needed for high blood pressure (over 140/90); may repeat in one hour if needed (two per 24 hours), Disp: 30 tablet, Rfl: 3 .  ranitidine (ZANTAC) 150 MG/10ML syrup, Take 10 mLs (150 mg total) by mouth 2 (two) times daily. If needed, Disp: 300 mL, Rfl: 2 .  camphor-menthol (SARNA) lotion, Apply 1 application topically as needed for itching. (Patient not taking: Reported on 11/26/2018), Disp: 222 mL, Rfl: 0 .  penicillin v potassium (VEETID) 500 MG tablet, Take 1 tablet (500 mg total) by mouth 4 (four) times daily. (Patient not taking: Reported on 11/26/2018), Disp: 40 tablet, Rfl: 0  No Known Allergies  I personally reviewed active problem list, medication list, allergies, notes from last encounter, lab results with the patient/caregiver today.  ROS  Ten systems reviewed and is negative except as mentioned in HPI  Objective  Virtual encounter, vitals not obtained.  There is no height or weight on file to calculate BMI.  Nursing Note and Vital Signs reviewed.  Physical Exam  Constitutional: Patient appears well-developed and well-nourished. No distress.  HENT: Head: Normocephalic and atraumatic.  Neck: Normal range of motion. Pulmonary/Chest: Effort normal. No respiratory distress. Speaking in complete sentences Neurological: Pt is alert and oriented to person, place, and time.  Coordination, speech and gait are normal.  Psychiatric: Patient has a normal mood and affect. behavior is normal. Judgment and thought content normal.    No results found for this or any previous visit (from the past 72 hour(s)).  Assessment & Plan  1. Exposure to herpes simplex virus (HSV) - Explained that a + plasma HSV testing does not mean that he was exposed by this prior partner, could represent previous  exposure/infection.  Also advised that this will not tell us where a potential outbreak could occur.  - HSV(herpes smplx)abs-1+2(IgG+IgM)-bld  2. Routine screening for STI (sexually transmitted infection) - Strongly recommend consistent condom use, trying to stick with one partner if possible, and reducing risk factors for unsafe sexual practices such as ETOH use. - HIV Antibody (routine testing w rflx) - RPR - HSV(herpes smplx)abs-1+2(IgG+IgM)-bld - Urine cytology ancillary only   -Red flags and when to present for emergency care or RTC including fever >101.14F, chest pain, shortness of breath, new/worsening/un-resolving symptoms, reviewed with patient at time of visit. Follow up and care instructions discussed and provided in AVS. - I discussed the assessment and treatment plan with the patient. The patient was provided an opportunity to ask questions and all were answered. The patient agreed with the plan and demonstrated an understanding of the instructions.  I provided 10 minutes of non-face-to-face time during this encounter.  Jason Custard, FNP

## 2020-08-10 ENCOUNTER — Emergency Department (HOSPITAL_BASED_OUTPATIENT_CLINIC_OR_DEPARTMENT_OTHER)
Admission: EM | Admit: 2020-08-10 | Discharge: 2020-08-10 | Disposition: A | Discharge status: Home / Self Care | Payer: BC Managed Care – PPO | Attending: Emergency Medicine | Admitting: Emergency Medicine

## 2020-08-10 ENCOUNTER — Encounter (HOSPITAL_BASED_OUTPATIENT_CLINIC_OR_DEPARTMENT_OTHER): Payer: Self-pay | Admitting: Emergency Medicine

## 2020-08-10 ENCOUNTER — Other Ambulatory Visit: Payer: Self-pay

## 2020-08-10 DIAGNOSIS — F17200 Nicotine dependence, unspecified, uncomplicated: Secondary | ICD-10-CM | POA: Diagnosis not present

## 2020-08-10 DIAGNOSIS — Z20822 Contact with and (suspected) exposure to covid-19: Secondary | ICD-10-CM | POA: Insufficient documentation

## 2020-08-10 DIAGNOSIS — T18108A Unspecified foreign body in esophagus causing other injury, initial encounter: Secondary | ICD-10-CM

## 2020-08-10 DIAGNOSIS — I10 Essential (primary) hypertension: Secondary | ICD-10-CM | POA: Diagnosis not present

## 2020-08-10 DIAGNOSIS — X58XXXA Exposure to other specified factors, initial encounter: Secondary | ICD-10-CM | POA: Diagnosis not present

## 2020-08-10 LAB — BASIC METABOLIC PANEL
Anion gap: 4 — ABNORMAL LOW (ref 5–15)
Anion gap: 9 (ref 5–15)
BUN: 13 mg/dL (ref 6–20)
BUN: 20 mg/dL (ref 6–20)
CO2: 20 mmol/L — ABNORMAL LOW (ref 22–32)
CO2: 25 mmol/L (ref 22–32)
Calcium: 5.7 mg/dL — CL (ref 8.9–10.3)
Calcium: 9.4 mg/dL (ref 8.9–10.3)
Chloride: 118 mmol/L — ABNORMAL HIGH (ref 98–111)
Chloride: 104 mmol/L (ref 98–111)
Creatinine, Ser: 0.58 mg/dL — ABNORMAL LOW (ref 0.61–1.24)
Creatinine, Ser: 1 mg/dL (ref 0.61–1.24)
GFR, Estimated: >60 mL/min (ref >60)
GFR, Estimated: >60 mL/min (ref >60)
Glucose, Bld: 59 mg/dL — ABNORMAL LOW (ref 70–99)
Glucose, Bld: 113 mg/dL — ABNORMAL HIGH (ref 70–99)
Potassium: 2.1 mmol/L — CL (ref 3.5–5.1)
Potassium: 3.5 mmol/L (ref 3.5–5.1)
Sodium: 142 mmol/L (ref 135–145)
Sodium: 138 mmol/L (ref 135–145)

## 2020-08-10 LAB — CBC WITH DIFFERENTIAL/PLATELET
Abs Immature Granulocytes: 0.01 10*3/uL (ref 0.00–0.07)
Basophils Absolute: 0.1 10*3/uL (ref 0.0–0.1)
Basophils Relative: 1 %
Eosinophils Absolute: 0.1 10*3/uL (ref 0.0–0.5)
Eosinophils Relative: 3 %
HCT: 43.7 % (ref 39.0–52.0)
Hemoglobin: 14.6 g/dL (ref 13.0–17.0)
Immature Granulocytes: 0 %
Lymphocytes Relative: 38 %
Lymphs Abs: 2.1 10*3/uL (ref 0.7–4.0)
MCH: 32.4 pg (ref 26.0–34.0)
MCHC: 33.4 g/dL (ref 30.0–36.0)
MCV: 96.9 fL (ref 80.0–100.0)
Monocytes Absolute: 0.3 10*3/uL (ref 0.1–1.0)
Monocytes Relative: 6 %
Neutro Abs: 2.9 10*3/uL (ref 1.7–7.7)
Neutrophils Relative %: 52 %
Platelets: 260 10*3/uL (ref 150–400)
RBC: 4.51 MIL/uL (ref 4.22–5.81)
RDW: 13.3 % (ref 11.5–15.5)
WBC: 5.6 10*3/uL (ref 4.0–10.5)
nRBC: 0 % (ref 0.0–0.2)

## 2020-08-10 LAB — RESP PANEL BY RT-PCR (FLU A&B, COVID) ARPGX2
Influenza A by PCR: NEGATIVE
Influenza B by PCR: NEGATIVE
SARS Coronavirus 2 by RT PCR: NEGATIVE

## 2020-08-10 MED ORDER — GLUCAGON HCL RDNA (DIAGNOSTIC) 1 MG IJ SOLR
1.0000 mg | Freq: Once | INTRAMUSCULAR | Status: AC
  Administered 2020-08-10: 1 mg via INTRAVENOUS
  Filled 2020-08-10: qty 1

## 2020-08-10 NOTE — ED Triage Notes (Signed)
Pt reports eating steak on Monday and getting a piece of steak stuck in his throat. Pt reports since then he has been unable to eat or drink anything. Pt denies SHOB or difficulty breathing.

## 2020-08-10 NOTE — ED Notes (Signed)
IV access attempted x2 without success.

## 2020-08-10 NOTE — ED Notes (Signed)
Tolerating po liquids well.  No vomiting. No dysphagia.

## 2020-08-10 NOTE — ED Provider Notes (Signed)
[facility] EMERGENCY DEPARTMENT Provider Note   CSN: [id number] Arrival date & time: 08/10/20  1033     History Chief Complaint  Patient presents with   Foreign Body in Throat    Jason Hale is a 30 y.o. male.  Pt reports he had a piece of steak get stuck in his throat on Monday.  Pt reports he is unable to swallow food or liquids Pt reports he has had the same in the past.  Pt has had his esophagus stretched.  Pt had a previous episode that responded to glucagon  Pt denies fever or chills    The history is provided by the patient. No language interpreter was used.      Past Medical History:  Diagnosis Date   Hypertension    IBS (irritable bowel syndrome)     Patient Active Problem List   Diagnosis Date Noted   Dyspepsia 05/16/2016   Bronchospasm 11/01/2015   Hypertension goal BP (blood pressure) < 140/90 04/05/2015   Intermittent dysphagia 04/05/2015    Past Surgical History:  Procedure Laterality Date   APPENDECTOMY     ESOPHAGOGASTRODUODENOSCOPY N/A 07/28/2014   Procedure: ESOPHAGOGASTRODUODENOSCOPY (EGD);  Surgeon: Dorena Cookey, MD;  Location: [facility] ENDOSCOPY;  Service: Endoscopy;  Laterality: N/A;   esophagus stretched  04/05/2017   FOREIGN BODY REMOVAL N/A 07/28/2014   Procedure: FOREIGN BODY REMOVAL;  Surgeon: Dorena Cookey, MD;  Location: [facility] ENDOSCOPY;  Service: Endoscopy;  Laterality: N/A;   TONSILLECTOMY     WISDOM TOOTH EXTRACTION         Family History  Problem Relation Age of Onset   Hypertension Mother    Diabetes Mother    Stroke Mother    Heart attack Mother    Asthma Son    Heart disease Maternal Grandfather    Heart attack Maternal Grandfather     Social History   Tobacco Use   Smoking status: Some Days    Pack years: 0.00   Smokeless tobacco: Never  Vaping Use   Vaping Use: Never used  Substance Use Topics   Alcohol use: Yes    Comment: occasional   Drug use: Yes    Types: Marijuana    Home Medications Prior to  Admission medications   Medication Sig Start Date End Date Taking? Authorizing Provider  albuterol (PROVENTIL HFA;VENTOLIN HFA) 108 (90 Base) MCG/ACT inhaler Inhale 2 puffs into the lungs every 4 (four) hours as needed for wheezing or shortness of breath. 11/01/15   Kerman Passey, MD  amLODipine (NORVASC) 2.5 MG tablet One pill by mouth daily if needed for high blood pressure (over 140/90); may repeat in one hour if needed (two per 24 hours) 06/28/17   Lada, Janit Bern, MD  camphor-menthol [facility]) lotion Apply 1 application topically as needed for itching. Patient not taking: Reported on 11/26/2018 11/26/17   Cheryle Horsfall, NP  penicillin v potassium (VEETID) 500 MG tablet Take 1 tablet (500 mg total) by mouth 4 (four) times daily. Patient not taking: Reported on 11/26/2018 09/22/18   Roxy Horseman, PA-C  ranitidine (ZANTAC) 150 MG/10ML syrup Take 10 mLs (150 mg total) by mouth 2 (two) times daily. If needed 11/01/15   Kerman Passey, MD    Allergies    Patient has no known allergies.  Review of Systems   Review of Systems  Constitutional:  Negative for chills and fever.  HENT:  Negative for sore throat.   Respiratory:  Negative for cough.   Gastrointestinal:  Negative for abdominal pain and vomiting.  Genitourinary:  Negative for dysuria.  Skin:  Negative for color change and rash.  All other systems reviewed and are negative.  Physical Exam Updated Vital Signs BP (!) 147/88 (BP Location: Right Arm)   Pulse 72   Temp 98.5 F (36.9 C) (Oral)   Resp 17   Ht 6\' 2"  (1.88 m)   Wt 88.5 kg   SpO2 100%   BMI 25.04 kg/m   Physical Exam Vitals and nursing note reviewed.  Constitutional:      Appearance: He is well-developed.  HENT:     Head: Normocephalic and atraumatic.     Mouth/Throat:     Mouth: Mucous membranes are moist.  Eyes:     Conjunctiva/sclera: Conjunctivae normal.  Cardiovascular:     Rate and Rhythm: Normal rate and regular rhythm.     Heart sounds: No  murmur heard. Pulmonary:     Effort: Pulmonary effort is normal. No respiratory distress.     Breath sounds: Normal breath sounds.  Abdominal:     Palpations: Abdomen is soft.     Tenderness: There is no abdominal tenderness. There is no rebound.  Musculoskeletal:        General: Normal range of motion.     Cervical back: Neck supple.  Skin:    General: Skin is warm and dry.  Neurological:     Mental Status: He is alert.    ED Results / Procedures / Treatments   Labs (all labs ordered are listed, but only abnormal results are displayed) Labs Reviewed - No data to display  EKG None  Radiology No results found.  Procedures Procedures   Medications Ordered in ED Medications - No data to display  ED Course  I have reviewed the triage vital signs and the nursing notes.  Pertinent labs & imaging results that were available during my care of the patient were reviewed by me and considered in my medical decision making (see chart for details).    MDM Rules/Calculators/A&P                          Pt given IV glucagon.  Pt able to tolerate fluids.  Pt drank a gingerale and a gatoraid  Pt reports all symptoms have resolved and he feels much better.  Pt counseled on the need to follow up with Gi for evaluation  Final Clinical Impression(s) / ED Diagnoses Final diagnoses:  Esophageal foreign body, initial encounter    Rx / DC Orders ED Discharge Orders     None     An After Visit Summary was printed and given to the patient.    , Elson Areas 08/10/20 1447    10/11/20, MD 08/11/20 [phone]

## 2020-08-10 NOTE — ED Notes (Signed)
Questioned results that were listed as critical, spoke with ED PA, lab redrawn was ordered, pt was also placed on cont cardiac monitoring for evaluation of cardiac status

## 2020-08-10 NOTE — Discharge Instructions (Addendum)
Follow up with GI.  Cut food into small pieces and chew well.

## 2020-11-02 ENCOUNTER — Emergency Department (HOSPITAL_BASED_OUTPATIENT_CLINIC_OR_DEPARTMENT_OTHER)
Admission: EM | Admit: 2020-11-02 | Discharge: 2020-11-02 | Disposition: A | Discharge status: Home / Self Care | Payer: BC Managed Care – PPO | Attending: Emergency Medicine | Admitting: Emergency Medicine

## 2020-11-02 ENCOUNTER — Encounter (HOSPITAL_BASED_OUTPATIENT_CLINIC_OR_DEPARTMENT_OTHER): Payer: Self-pay

## 2020-11-02 ENCOUNTER — Other Ambulatory Visit: Payer: Self-pay

## 2020-11-02 DIAGNOSIS — Z79899 Other long term (current) drug therapy: Secondary | ICD-10-CM | POA: Insufficient documentation

## 2020-11-02 DIAGNOSIS — I10 Essential (primary) hypertension: Secondary | ICD-10-CM | POA: Diagnosis not present

## 2020-11-02 DIAGNOSIS — Z87891 Personal history of nicotine dependence: Secondary | ICD-10-CM | POA: Diagnosis not present

## 2020-11-02 DIAGNOSIS — R220 Localized swelling, mass and lump, head: Secondary | ICD-10-CM | POA: Insufficient documentation

## 2020-11-02 NOTE — ED Notes (Signed)
Pt speaking in full sentences. Pt denies shob, denies difficulty breathing. Upper lip swelling noted. Pt denies new foods to pts knowledge.

## 2020-11-02 NOTE — ED Triage Notes (Signed)
Pt c/o facial swelling x 2 days-NAD-steady gait

## 2020-11-02 NOTE — ED Provider Notes (Signed)
[facility] EMERGENCY DEPARTMENT Provider Note   CSN: [id number] Arrival date & time: 11/02/20  1058     History Chief Complaint  Patient presents with   Facial Swelling    Jason Hale is a 30 y.o. male with no significant past medical history who reports 2 days of facial swelling, itching, plus or minus hives.  Patient reports he has not had any new foods, denies new contact with insect, not taking any new medications.  Patient does report some history of IBS, and gastric reflux.  Patient reports that he has been taking a lot of wintergreen mints to help relieve his reflux symptoms.  Patient also endorses recently consuming peanuts, however normally consumes peanuts.  Patient reports he has taken some Benadryl, and applied topical Benadryl cream to his face to try to relieve his facial swelling.  Patient reports minimal improvement over 1 day.  Patient denies shortness of breath, chest pain, nausea, vomiting, increasing diarrhea, although he reports he has some baseline diarrhea secondary to IBS.  Patient denies sense of impending doom, lightheadedness.  HPI     Past Medical History:  Diagnosis Date   Hypertension    IBS (irritable bowel syndrome)     Patient Active Problem List   Diagnosis Date Noted   Dyspepsia 05/16/2016   Bronchospasm 11/01/2015   Hypertension goal BP (blood pressure) < 140/90 04/05/2015   Intermittent dysphagia 04/05/2015    Past Surgical History:  Procedure Laterality Date   APPENDECTOMY     ESOPHAGOGASTRODUODENOSCOPY N/A 07/28/2014   Procedure: ESOPHAGOGASTRODUODENOSCOPY (EGD);  Surgeon: Dorena Cookey, MD;  Location: [facility] ENDOSCOPY;  Service: Endoscopy;  Laterality: N/A;   esophagus stretched  04/05/2017   FOREIGN BODY REMOVAL N/A 07/28/2014   Procedure: FOREIGN BODY REMOVAL;  Surgeon: Dorena Cookey, MD;  Location: [facility] ENDOSCOPY;  Service: Endoscopy;  Laterality: N/A;   TONSILLECTOMY     WISDOM TOOTH EXTRACTION         Family History  Problem  Relation Age of Onset   Hypertension Mother    Diabetes Mother    Stroke Mother    Heart attack Mother    Asthma Son    Heart disease Maternal Grandfather    Heart attack Maternal Grandfather     Social History   Tobacco Use   Smoking status: Former   Smokeless tobacco: Never  Building services engineer Use: Every day  Substance Use Topics   Alcohol use: Yes    Comment: occasional   Drug use: Yes    Types: Marijuana    Home Medications Prior to Admission medications   Medication Sig Start Date End Date Taking? Authorizing Provider  albuterol (PROVENTIL HFA;VENTOLIN HFA) 108 (90 Base) MCG/ACT inhaler Inhale 2 puffs into the lungs every 4 (four) hours as needed for wheezing or shortness of breath. 11/01/15   Kerman Passey, MD  amLODipine (NORVASC) 2.5 MG tablet One pill by mouth daily if needed for high blood pressure (over 140/90); may repeat in one hour if needed (two per 24 hours) 06/28/17   Lada, Janit Bern, MD  ranitidine (ZANTAC) 150 MG/10ML syrup Take 10 mLs (150 mg total) by mouth 2 (two) times daily. If needed 11/01/15   Kerman Passey, MD    Allergies    Patient has no known allergies.  Review of Systems   Review of Systems  HENT:  Positive for facial swelling. Negative for trouble swallowing.   Respiratory:  Negative for chest tightness and shortness of breath.  Cardiovascular:  Negative for chest pain.  All other systems reviewed and are negative.  Physical Exam Updated Vital Signs BP (!) 138/99 (BP Location: Left Arm)   Pulse 85   Temp 98.7 F (37.1 C) (Oral)   Resp 20   Ht 6' (1.829 m)   Wt 88.9 kg   SpO2 100%   BMI 26.58 kg/m   Physical Exam Vitals and nursing note reviewed.  Constitutional:      General: He is not in acute distress.    Appearance: Normal appearance.  HENT:     Head: Normocephalic and atraumatic.     Comments: Some swelling of upper lip noted fairly symmetrical in nature.  Some periorbital edema noted right greater than left.  No  signs of infection including heat, or purulent drainage. Eyes:     General:        Right eye: No discharge.        Left eye: No discharge.  Cardiovascular:     Rate and Rhythm: Normal rate and regular rhythm.     Heart sounds: No murmur heard.   No friction rub. No gallop.  Pulmonary:     Effort: Pulmonary effort is normal.     Breath sounds: Normal breath sounds.  Abdominal:     General: Bowel sounds are normal.     Palpations: Abdomen is soft.  Skin:    General: Skin is warm and dry.     Capillary Refill: Capillary refill takes less than 2 seconds.     Comments: Skin exam does not reveal any areas of hives.  Neurological:     Mental Status: He is alert and oriented to person, place, and time.  Psychiatric:        Mood and Affect: Mood normal.        Behavior: Behavior normal.    ED Results / Procedures / Treatments   Labs (all labs ordered are listed, but only abnormal results are displayed) Labs Reviewed  URINALYSIS, ROUTINE W REFLEX MICROSCOPIC    EKG None  Radiology No results found.  Procedures Procedures   Medications Ordered in ED Medications - No data to display  ED Course  I have reviewed the triage vital signs and the nursing notes.  Pertinent labs & imaging results that were available during my care of the patient were reviewed by me and considered in my medical decision making (see chart for details).    MDM Rules/Calculators/A&P                         I discussed this case with my attending physician who cosigned this note including patient's presenting symptoms, physical exam, and planned diagnostics and interventions. Attending physician stated agreement with plan or made changes to plan which were implemented.   I discussed this case with my attending physician who cosigned this note including patient's presenting symptoms, physical exam, and planned diagnostics and interventions. Attending physician stated agreement with plan or made changes  to plan which were implemented.   Patient with some signs and symptoms of an allergic reaction without signs of anaphylaxis.  Patient without history of anaphylaxis.  Patient has no difficulty breathing, no chest pain, no difficulty swallowing.  Patient is in stable condition, no acute distress.  Patient without systemic symptoms of a rash, or hives.  Recommend continued use of antihistamines, follow-up if symptoms worsen or fail to improve.  Discussed I recommend collecting a urine to rule out kidney abnormality.  Patient left without providing urine sample. Some return precautions were given prior to elopement.  Cannot rule out kidney abnormality.  Patient left without completing his work-up.  Final Clinical Impression(s) / ED Diagnoses Final diagnoses:  Facial swelling    Rx / DC Orders ED Discharge Orders     None        [place] 11/02/20 1328    Gloris Manchester, MD 11/03/20 1939

## 2020-11-02 NOTE — ED Notes (Signed)
Pt sts he needs to go to work, so he is leaving

## 2020-11-02 NOTE — Discharge Instructions (Addendum)
Prior to elopement discussed likely benign allergic reaction, minimal concern for anaphylaxis.  Recommended checking a urine to rule out kidney abnormality.  Patient eloped without providing a urine.  Return precautions were given prior to elopement.

## 2020-11-03 ENCOUNTER — Encounter (HOSPITAL_COMMUNITY): Payer: Self-pay | Admitting: Emergency Medicine

## 2020-11-03 ENCOUNTER — Emergency Department (HOSPITAL_COMMUNITY)
Admission: EM | Admit: 2020-11-03 | Discharge: 2020-11-03 | Disposition: A | Discharge status: Home / Self Care | Payer: BC Managed Care – PPO | Attending: Emergency Medicine | Admitting: Emergency Medicine

## 2020-11-03 DIAGNOSIS — H5789 Other specified disorders of eye and adnexa: Secondary | ICD-10-CM | POA: Insufficient documentation

## 2020-11-03 DIAGNOSIS — R21 Rash and other nonspecific skin eruption: Secondary | ICD-10-CM | POA: Insufficient documentation

## 2020-11-03 DIAGNOSIS — Z5321 Procedure and treatment not carried out due to patient leaving prior to being seen by health care provider: Secondary | ICD-10-CM | POA: Diagnosis not present

## 2020-11-03 MED ORDER — DIPHENHYDRAMINE HCL 25 MG PO CAPS
50.0000 mg | ORAL_CAPSULE | Freq: Once | ORAL | Status: DC
  Filled 2020-11-03: qty 2

## 2020-11-03 NOTE — ED Notes (Signed)
Patient refused benadryl capsule stating he is unable to swallow capsules.

## 2020-11-03 NOTE — ED Triage Notes (Signed)
Pt endorses rash since Sunday. No relief with benadryl and oatmeal baths. Redness over both eyes and over torso and legs.

## 2020-11-03 NOTE — ED Provider Notes (Signed)
Emergency Medicine Provider Triage Evaluation Note  Jason Hale , a 30 y.o. male  was evaluated in triage.  Pt complains of allergic reaction.  Was seen at med center yesterday but left secondary to the weight.  He has been complaining of hives to the face and torso with associated lip swelling and trouble swallowing.  He has been able to eat and tolerate p.o. this morning.  No shortness of breath, chest pain, abdominal pain, nausea, vomiting, diarrhea.  Review of Systems  Positive:  Negative: See above   Physical Exam  BP (!) 154/94   Pulse 81   Temp 99 F (37.2 C) (Oral)   Resp 16   Ht 6' (1.829 m)   Wt 88.9 kg   SpO2 100%   BMI 26.58 kg/m  Gen:   Awake, no distress   Resp:  Normal effort, clear to auscultation bilaterally MSK:   Moves extremities without difficulty  Other:  No pharyngeal edema or erythema.  Uvula is midline and nonedematous.  Tongue is normal not swollen.  There is mild swelling of the upper and lower lips.  Medical Decision Making  Medically screening exam initiated at 3:08 PM.  Appropriate orders placed.  [name] was informed that the remainder of the evaluation will be completed by another provider, this initial triage assessment does not replace that evaluation, and the importance of remaining in the ED until their evaluation is complete.     Honor Loh Belle, PA-C 11/03/20 1510    Alvira Monday, MD 11/03/20 2308

## 2020-11-04 ENCOUNTER — Encounter (HOSPITAL_COMMUNITY): Payer: Self-pay | Admitting: Emergency Medicine

## 2020-11-04 ENCOUNTER — Emergency Department (HOSPITAL_COMMUNITY)
Admission: EM | Admit: 2020-11-04 | Discharge: 2020-11-04 | Disposition: A | Discharge status: Home / Self Care | Payer: BC Managed Care – PPO | Attending: Emergency Medicine | Admitting: Emergency Medicine

## 2020-11-04 ENCOUNTER — Other Ambulatory Visit: Payer: Self-pay

## 2020-11-04 DIAGNOSIS — L509 Urticaria, unspecified: Secondary | ICD-10-CM | POA: Diagnosis not present

## 2020-11-04 DIAGNOSIS — J029 Acute pharyngitis, unspecified: Secondary | ICD-10-CM | POA: Insufficient documentation

## 2020-11-04 DIAGNOSIS — Z79899 Other long term (current) drug therapy: Secondary | ICD-10-CM | POA: Diagnosis not present

## 2020-11-04 DIAGNOSIS — R197 Diarrhea, unspecified: Secondary | ICD-10-CM | POA: Insufficient documentation

## 2020-11-04 DIAGNOSIS — Z87891 Personal history of nicotine dependence: Secondary | ICD-10-CM | POA: Insufficient documentation

## 2020-11-04 DIAGNOSIS — R21 Rash and other nonspecific skin eruption: Secondary | ICD-10-CM | POA: Diagnosis present

## 2020-11-04 DIAGNOSIS — I10 Essential (primary) hypertension: Secondary | ICD-10-CM | POA: Diagnosis not present

## 2020-11-04 MED ORDER — PREDNISONE 20 MG PO TABS: 20.0000 mg | ORAL_TABLET | Freq: Two times a day (BID) | ORAL | 0 refills | Status: AC

## 2020-11-04 MED ORDER — PREDNISONE 20 MG PO TABS
60.0000 mg | ORAL_TABLET | Freq: Once | ORAL | Status: AC
Start: 2020-11-04 — End: 2020-11-04
  Administered 2020-11-04: 60 mg via ORAL
  Filled 2020-11-04: qty 3

## 2020-11-04 MED ORDER — FAMOTIDINE 20 MG PO TABS: 20.0000 mg | ORAL_TABLET | Freq: Two times a day (BID) | ORAL | 0 refills | Status: DC

## 2020-11-04 MED ORDER — HYDROXYZINE HCL 25 MG PO TABS: 25.0000 mg | ORAL_TABLET | Freq: Four times a day (QID) | ORAL | 0 refills | Status: AC

## 2020-11-04 NOTE — ED Triage Notes (Addendum)
Patient presents with a rash he believes was a reaction to [place] he ate on Sunday. He noticed swelling and a rash Monday. Today he is experiencing neck pain and his :body gets hot." He has tried benadryl and oatmeal baths with no relief. Patient states it is difficult for him to swallow and has shortness of breath with activity.

## 2020-11-04 NOTE — ED Provider Notes (Signed)
[facility]-EMERGENCY DEPT Provider Note   CSN: [id number] Arrival date & time: 11/04/20  1709     History No chief complaint on file.   Jason Hale is a 30 y.o. male with history of hypertension, IBS, and hypertension.  Presents to the emergency department with a chief complaint of rash.  Patient reports that his rash started on Sunday and has been present since then.  Patient reports rash is located to face, chest, abdomen, back, and bilateral upper and lower extremities.  Rash is pruritic in nature.  Rash has been transiently moving throughout his entire body.  Patient has been taking Benadryl with no relief of symptoms.  She reports that initially he had swelling to his lips however this gradually improved over time.  Patient reports diarrhea on Sunday and Monday.    Patient reports today he started having sore throat and felt like he was having difficulty swallowing.  Patient denies any drooling, trismus, hot potato voice, trouble breathing.  HPI     Past Medical History:  Diagnosis Date   Hypertension    IBS (irritable bowel syndrome)     Patient Active Problem List   Diagnosis Date Noted   Dyspepsia 05/16/2016   Bronchospasm 11/01/2015   Hypertension goal BP (blood pressure) < 140/90 04/05/2015   Intermittent dysphagia 04/05/2015    Past Surgical History:  Procedure Laterality Date   APPENDECTOMY     ESOPHAGOGASTRODUODENOSCOPY N/A 07/28/2014   Procedure: ESOPHAGOGASTRODUODENOSCOPY (EGD);  Surgeon: Dorena Cookey, MD;  Location: [facility] ENDOSCOPY;  Service: Endoscopy;  Laterality: N/A;   esophagus stretched  04/05/2017   FOREIGN BODY REMOVAL N/A 07/28/2014   Procedure: FOREIGN BODY REMOVAL;  Surgeon: Dorena Cookey, MD;  Location: [facility] ENDOSCOPY;  Service: Endoscopy;  Laterality: N/A;   TONSILLECTOMY     WISDOM TOOTH EXTRACTION         Family History  Problem Relation Age of Onset   Hypertension Mother    Diabetes Mother    Stroke Mother    Heart attack  Mother    Asthma Son    Heart disease Maternal Grandfather    Heart attack Maternal Grandfather     Social History   Tobacco Use   Smoking status: Former   Smokeless tobacco: Never  Building services engineer Use: Every day  Substance Use Topics   Alcohol use: Yes    Comment: occasional   Drug use: Yes    Types: Marijuana    Home Medications Prior to Admission medications   Medication Sig Start Date End Date Taking? Authorizing Provider  albuterol (PROVENTIL HFA;VENTOLIN HFA) 108 (90 Base) MCG/ACT inhaler Inhale 2 puffs into the lungs every 4 (four) hours as needed for wheezing or shortness of breath. 11/01/15   Kerman Passey, MD  amLODipine (NORVASC) 2.5 MG tablet One pill by mouth daily if needed for high blood pressure (over 140/90); may repeat in one hour if needed (two per 24 hours) 06/28/17   Lada, Janit Bern, MD  ranitidine (ZANTAC) 150 MG/10ML syrup Take 10 mLs (150 mg total) by mouth 2 (two) times daily. If needed 11/01/15   Kerman Passey, MD    Allergies    Patient has no known allergies.  Review of Systems   Review of Systems  Constitutional:  Negative for chills and fever.  HENT:  Positive for sore throat and trouble swallowing. Negative for drooling and voice change.   Eyes:  Negative for visual disturbance.  Respiratory:  Negative for shortness  of breath.   Cardiovascular:  Negative for chest pain.  Gastrointestinal:  Negative for abdominal pain, diarrhea, nausea and vomiting.  Musculoskeletal:  Negative for back pain and neck pain.  Skin:  Positive for rash. Negative for color change, pallor and wound.  Neurological:  Negative for dizziness, syncope, light-headedness and headaches.  Psychiatric/Behavioral:  Negative for confusion.    Physical Exam Updated Vital Signs BP (!) 146/106 (BP Location: Right Arm)   Pulse 96   Temp 98.5 F (36.9 C) (Oral)   Resp (!) 22   SpO2 100%   Physical Exam Vitals and nursing note reviewed.  Constitutional:      General:  He is not in acute distress.    Appearance: He is not ill-appearing, toxic-appearing or diaphoretic.  HENT:     Head: Normocephalic. No right periorbital erythema or left periorbital erythema.     Jaw: No trismus or pain on movement.     Mouth/Throat:     Mouth: Mucous membranes are moist. No injury, lacerations, oral lesions or angioedema.     Tongue: No lesions. Tongue does not deviate from midline.     Palate: No mass and lesions.     Pharynx: Oropharynx is clear. Uvula midline. No pharyngeal swelling, oropharyngeal exudate, posterior oropharyngeal erythema or uvula swelling.     Tonsils: No tonsillar exudate or tonsillar abscesses. 0 on the right. 0 on the left.     Comments: No swelling to oral mucosa.  Patient able to handle oral secretions without difficulty. Eyes:     General: No scleral icterus.       Right eye: No discharge.        Left eye: No discharge.     Comments: Minimal edema noted below patient's eyes sensitive bilateral upper eyelids  Cardiovascular:     Rate and Rhythm: Normal rate.  Pulmonary:     Effort: Pulmonary effort is normal. No tachypnea, bradypnea or respiratory distress.     Breath sounds: Normal breath sounds. No stridor.     Comments: Patient speaks in full clear sentences without difficulty Skin:    General: Skin is warm and dry.     Findings: Rash present. No petechiae. Rash is urticarial. Rash is not purpuric, pustular or vesicular.     Comments: Scattered urticarial rash noted to patient's face, back, chest, abdomen, and bilateral upper extremities  Neurological:     General: No focal deficit present.     Mental Status: He is alert.  Psychiatric:        Behavior: Behavior is cooperative.    ED Results / Procedures / Treatments   Labs (all labs ordered are listed, but only abnormal results are displayed) Labs Reviewed - No data to display  EKG None  Radiology No results found.  Procedures Procedures   Medications Ordered in  ED Medications  predniSONE (DELTASONE) tablet 60 mg (60 mg Oral Given 11/04/20 1858)    ED Course  I have reviewed the triage vital signs and the nursing notes.  Pertinent labs & imaging results that were available during my care of the patient were reviewed by me and considered in my medical decision making (see chart for details).    MDM Rules/Calculators/A&P                           Alert 30 year old male no acute distress, nontoxic appearing.  Presents to emergency department with chief complaint of urticarial rash.  Rash has been  present since Sunday.  Patient reports that he had trouble swallowing and sore throat today.  Patient denies any lisinopril use.  No known allergies.  No new foods, personal cleaners, detergents.  Patient able to handle oral secretions without difficulty.  No angioedema.  No swelling to oropharynx or oral mucosa.  Lungs clear to auscultation bilaterally.  Low suspicion for anaphylactic reaction at this time.  Patient noted to have scattered urticarial rash.  We will start patient on prednisone, famotidine, and hydroxyzine.  Patient to follow-up with primary care provider if symptoms do not improve.  Patient was discussed with and evaluated by Dr. Wentz.  Discussed results, findings, treatment and follow up. Patient advised of return precautions. Patient verbalized understanding and agreed with plan.    Final Clinical Impression(s) / ED Diagnoses Final diagnoses:  Urticaria    Rx / DC Orders ED Discharge Orders          Ordered    predniSONE (DELTASONE) 20 MG tablet  2 times daily with meals        11/04/20 1838    famotidine (PEPCID) 20 MG tablet  2 times daily        11/04/20 1838    hydrOXYzine (ATARAX/VISTARIL) 25 MG tablet  4 times daily        09 /30/22 05-27-1972, PA-C 11/05/20 0003    01/05/21, MD 11/05/20 1108    01/05/21, MD 11/05/20 1122

## 2020-11-04 NOTE — Discharge Instructions (Addendum)
You came to the emergency department today to be evaluated for your rash.  Your rash appears urticarial in nature.  Due to your rash you were started on prednisone, hydroxyzine, and Pepcid.  Please take these medications as prescribed.  If your symptoms do no improve please follow-up with your primary care provider.  I have given you a prescription for steroids today.  Some common side effects include feelings of extra energy, feeling warm, increased appetite, and stomach upset.  If you are diabetic your sugars may run higher than usual.    Get help right away if you: Have a fever and your symptoms suddenly get worse. Develop confusion. Have a severe headache or a stiff neck. Have severe joint pains or stiffness. Have a seizure. Develop a rash that covers all or most of your body. The rash may or may not be painful. Develop blisters that: Are on top of the rash. Grow larger or grow together. Are painful. Are inside your nose or mouth. Develop a rash that: Looks like purple pinprick-sized spots all over your body. Has a "bull's eye" or looks like a target. Is not related to sun exposure, is red and painful, and causes your skin to peel.

## 2020-11-04 NOTE — ED Provider Notes (Signed)
  Face-to-face evaluation   History: He presents for evaluation of persistent allergic reaction, not improving with Benadryl.  He was seen 2 days ago at which time he had primarily symptoms around the eyes and his lips.  Since then he has noted a generalized red rash that moves around and is pruritic.  He denies shortness of breath.  He has chronic problems swallowing and usually crushes the pills to take them.  There are no other known active modifying factors.  Physical exam: Middle-age male, presenting for evaluation of persistent allergic reaction.  No respiratory distress.  Minimal angioedema beneath eyes and upper eyelids, and upper and lower lips.  Scattered generalized urticarial rash of the trunk and extremities.  Medical screening examination/treatment/procedure(s) were conducted as a shared visit with non-physician practitioner(s) and myself.  I personally evaluated the patient during the encounter    [name], MD 11/05/20 1122

## 2020-11-08 ENCOUNTER — Ambulatory Visit (INDEPENDENT_AMBULATORY_CARE_PROVIDER_SITE_OTHER): Payer: Self-pay | Admitting: Family Medicine

## 2020-11-08 NOTE — Progress Notes (Signed)
No show for appointment. No charge.  

## 2020-11-18 ENCOUNTER — Ambulatory Visit: Payer: BC Managed Care – PPO | Admitting: Nurse Practitioner

## 2020-11-21 ENCOUNTER — Ambulatory Visit: Payer: BC Managed Care – PPO | Admitting: Nurse Practitioner

## 2020-11-21 ENCOUNTER — Other Ambulatory Visit (HOSPITAL_COMMUNITY)
Admission: RE | Admit: 2020-11-21 | Discharge: 2020-11-21 | Disposition: A | Discharge status: Home / Self Care | Payer: BC Managed Care – PPO | Source: Ambulatory Visit | Attending: Family Medicine | Admitting: Family Medicine

## 2020-11-21 ENCOUNTER — Encounter: Payer: Self-pay | Admitting: Nurse Practitioner

## 2020-11-21 ENCOUNTER — Other Ambulatory Visit: Payer: Self-pay

## 2020-11-21 VITALS — BP 144/88 | HR 92 | Temp 98.3°F | Resp 18 | Ht 74.0 in | Wt 194.4 lb

## 2020-11-21 DIAGNOSIS — Z202 Contact with and (suspected) exposure to infections with a predominantly sexual mode of transmission: Secondary | ICD-10-CM | POA: Diagnosis present

## 2020-11-21 DIAGNOSIS — I10 Essential (primary) hypertension: Secondary | ICD-10-CM | POA: Diagnosis not present

## 2020-11-21 DIAGNOSIS — R21 Rash and other nonspecific skin eruption: Secondary | ICD-10-CM | POA: Diagnosis not present

## 2020-11-21 DIAGNOSIS — J9801 Acute bronchospasm: Secondary | ICD-10-CM | POA: Diagnosis not present

## 2020-11-21 MED ORDER — AMLODIPINE BESYLATE 2.5 MG PO TABS: ORAL_TABLET | ORAL | 3 refills | Status: AC

## 2020-11-21 MED ORDER — ALBUTEROL SULFATE HFA 108 (90 BASE) MCG/ACT IN AERS: 2.0000 | INHALATION_SPRAY | RESPIRATORY_TRACT | 1 refills | Status: AC | PRN

## 2020-11-21 MED ORDER — HYDROXYZINE PAMOATE 25 MG PO CAPS: 25.0000 mg | ORAL_CAPSULE | Freq: Three times a day (TID) | ORAL | 0 refills | Status: AC | PRN

## 2020-11-21 MED ORDER — FAMOTIDINE 20 MG PO TABS: 20.0000 mg | ORAL_TABLET | Freq: Two times a day (BID) | ORAL | 0 refills | Status: AC

## 2020-11-21 NOTE — Progress Notes (Signed)
BP (!) 144/88   Pulse 92   Temp 98.3 F (36.8 C) (Oral)   Resp 18   Ht 6\' 2"  (1.88 m)   Wt 194 lb 6.4 oz (88.2 kg)   SpO2 96%   BMI 24.96 kg/m    Subjective:    Patient ID: , male    DOB: 07-25-1990, 30 y.o.   MRN: [id number]  HPI: Jason Hale is a 30 y.o. male, here alone  Chief Complaint  Patient presents with   Rash    All over for past 2 weeks   Rash;  He says he has had a rash/hives appear on different parts of his body. He denies changing any soaps, detergents or medications.  He said he thought it might be the latex condoms he was using so he stopped using them but it was still happening.  He said he had been using benadryl and that he was seen in the er and was given prescription for pepcid, hydroxyzine and prednisone.  He says he finished the prescriptions and he continues to have flares.  Discussed continue taking pepcid, benadryl and hydroxyzine as needed. Will send to allergist.  HTN:  He has been out of his blood pressure medication for awhile. He does not take his blood pressure at home.  Discussed the importance of taking care of himself.  Blood pressure today is 144/88.  Will restart amlodipine 2.5 mg daily.  He denies any chest pain.   Bronchospasm:  He says he occasionally has a bronchospasm but has been out of his inhaler.  He denies any significant shortness of breath.  Will refill albuterol.  Exposure to STD:  He says a girl he is sexually active with told him that she tested positive for HPV.  He would like to be tested.  He denies any penile discharge or pain.   Relevant past medical, surgical, family and social history reviewed and updated as indicated. Interim medical history since our last visit reviewed. Allergies and medications reviewed and updated.  Review of Systems  Constitutional: Negative for fever or weight change.  Respiratory: Negative for cough and shortness of breath.   Cardiovascular: Negative for chest pain or  palpitations.  Gastrointestinal: Negative for abdominal pain, no bowel changes.  Genitourinary:  negative for penile discharge or dysuria.  Musculoskeletal: Negative for gait problem or joint swelling.  Skin: Positive for rash.  Neurological: Negative for dizziness or headache.  No other specific complaints in a complete review of systems (except as listed in HPI above).      Objective:    BP (!) 144/88   Pulse 92   Temp 98.3 F (36.8 C) (Oral)   Resp 18   Ht 6\' 2"  (1.88 m)   Wt 194 lb 6.4 oz (88.2 kg)   SpO2 96%   BMI 24.96 kg/m   Wt Readings from Last 3 Encounters:  11/21/20 194 lb 6.4 oz (88.2 kg)  11/03/20 195 lb 15.8 oz (88.9 kg)  11/02/20 196 lb (88.9 kg)    Physical Exam  Constitutional: Patient appears well-developed and well-nourished. Obese No distress.  HEENT: head atraumatic, normocephalic, pupils equal and reactive to light,  neck supple Cardiovascular: Normal rate, regular rhythm and normal heart sounds.  No murmur heard. No BLE edema. Pulmonary/Chest: Effort normal and breath sounds normal. No respiratory distress. Abdominal: Soft.  There is no tenderness. Skin: no current rash Psychiatric: Patient has a normal mood and affect. behavior is normal. Judgment and thought content  normal.   Results for orders placed or performed during the hospital encounter of 08/10/20  Resp Panel by RT-PCR (Flu A&B, Covid) Nasopharyngeal Swab   Specimen: Nasopharyngeal Swab; Nasopharyngeal(NP) swabs in vial transport medium  Result Value Ref Range   SARS Coronavirus 2 by RT PCR NEGATIVE NEGATIVE   Influenza A by PCR NEGATIVE NEGATIVE   Influenza B by PCR NEGATIVE NEGATIVE  CBC with Differential/Platelet  Result Value Ref Range   WBC 5.6 4.0 - 10.5 K/uL   RBC 4.51 4.22 - 5.81 MIL/uL   Hemoglobin 14.6 13.0 - 17.0 g/dL   HCT 24.2 68.3 - 41.9 %   MCV 96.9 80.0 - 100.0 fL   MCH 32.4 26.0 - 34.0 pg   MCHC 33.4 30.0 - 36.0 g/dL   RDW 62.2 29.7 - 98.9 %   Platelets 260 150 -  400 K/uL   nRBC 0.0 0.0 - 0.2 %   Neutrophils Relative % 52 %   Neutro Abs 2.9 1.7 - 7.7 K/uL   Lymphocytes Relative 38 %   Lymphs Abs 2.1 0.7 - 4.0 K/uL   Monocytes Relative 6 %   Monocytes Absolute 0.3 0.1 - 1.0 K/uL   Eosinophils Relative 3 %   Eosinophils Absolute 0.1 0.0 - 0.5 K/uL   Basophils Relative 1 %   Basophils Absolute 0.1 0.0 - 0.1 K/uL   Immature Granulocytes 0 %   Abs Immature Granulocytes 0.01 0.00 - 0.07 K/uL  Basic metabolic panel  Result Value Ref Range   Sodium 142 135 - 145 mmol/L   Potassium 2.1 (LL) 3.5 - 5.1 mmol/L   Chloride 118 (H) 98 - 111 mmol/L   CO2 20 (L) 22 - 32 mmol/L   Glucose, Bld 59 (L) 70 - 99 mg/dL   BUN 13 6 - 20 mg/dL   Creatinine, Ser 2.11 (L) 0.61 - 1.24 mg/dL   Calcium 5.7 (LL) 8.9 - 10.3 mg/dL   GFR, Estimated >94 >17 mL/min   Anion gap 4 (L) 5 - 15  Basic metabolic panel  Result Value Ref Range   Sodium 138 135 - 145 mmol/L   Potassium 3.5 3.5 - 5.1 mmol/L   Chloride 104 98 - 111 mmol/L   CO2 25 22 - 32 mmol/L   Glucose, Bld 113 (H) 70 - 99 mg/dL   BUN 20 6 - 20 mg/dL   Creatinine, Ser 4.08 0.61 - 1.24 mg/dL   Calcium 9.4 8.9 - 14.4 mg/dL   GFR, Estimated >81 >85 mL/min   Anion gap 9 5 - 15      Assessment & Plan:   1. Rash  - famotidine (PEPCID) 20 MG tablet; Take 1 tablet (20 mg total) by mouth 2 (two) times daily for 5 days.  Dispense: 10 tablet; Refill: 0 - Ambulatory referral to Allergy - hydrOXYzine (VISTARIL) 25 MG capsule; Take 1 capsule (25 mg total) by mouth every 8 (eight) hours as needed.  Dispense: 30 capsule; Refill: 0  2. Hypertension goal BP (blood pressure) < 140/90  - amLODipine (NORVASC) 2.5 MG tablet; One pill by mouth daily if needed for high blood pressure (over 140/90); may repeat in one hour if needed (two per 24 hours)  Dispense: 30 tablet; Refill: 3  3. Bronchospasm  - albuterol (VENTOLIN HFA) 108 (90 Base) MCG/ACT inhaler; Inhale 2 puffs into the lungs every 4 (four) hours as needed for  wheezing or shortness of breath.  Dispense: 1 each; Refill: 1  4. Exposure to STD  - Urine  cytology ancillary only   Follow up plan: Return in about 3 months (around 02/21/2021) for follow up.

## 2020-11-23 LAB — URINE CYTOLOGY ANCILLARY ONLY
Chlamydia: NEGATIVE
Comment: NEGATIVE
Comment: NEGATIVE
Comment: NORMAL
Neisseria Gonorrhea: NEGATIVE
Trichomonas: NEGATIVE

## 2021-01-26 NOTE — Progress Notes (Deleted)
NEW PATIENT Date of Service/Encounter:  01/26/21 Referring provider: Della Goo, FNP Primary care provider: Berniece Salines, FNP  Subjective:  Jason Hale is a 30 y.o. male with a PMHx of hypertension, intermittent dysphagia, dyspepsia, bronchospasm presenting today for evaluation of rash. History obtained from: chart review and {Persons; PED relatives w/patient:19415::"patient"}.   Rash: started ***, occurring *** Associates *** Denies other systemic symptoms including no respiratory, gastrointestinal or cardiovascular distress. *** changes to personal care products, detergents, etc Therapies tried: *** Potential triggers: *** Does *** associate fever, joint pain, joint swelling, weight loss.  Lesions resolve in ***  *** bruising on resolution. *** recent illness. Pictures reviewed ***  Chart review: PCP visit 11/21/2020 hives started on famotidine 20 mg twice daily and hydroxyzine 25 mg every 8 hours as needed Multiple ED visits in September for hives and swelling  Other allergy screening: Asthma: {Blank single:19197::"yes","no"} Rhino conjunctivitis: {Blank single:19197::"yes","no"} Food allergy: {Blank single:19197::"yes","no"} Medication allergy: {Blank single:19197::"yes","no"} Hymenoptera allergy: {Blank single:19197::"yes","no"} Urticaria: {Blank single:19197::"yes","no"} Eczema:{Blank single:19197::"yes","no"} History of recurrent infections suggestive of immunodeficency: {Blank single:19197::"yes","no"} ***Vaccinations are up to date.   Past Medical History: Past Medical History:  Diagnosis Date   Hypertension    IBS (irritable bowel syndrome)    Medication List:  Current Outpatient Medications  Medication Sig Dispense Refill   albuterol (VENTOLIN HFA) 108 (90 Base) MCG/ACT inhaler Inhale 2 puffs into the lungs every 4 (four) hours as needed for wheezing or shortness of breath. 1 each 1   amLODipine (NORVASC) 2.5 MG tablet One pill by mouth daily if  needed for high blood pressure (over 140/90); may repeat in one hour if needed (two per 24 hours) 30 tablet 3   famotidine (PEPCID) 20 MG tablet Take 1 tablet (20 mg total) by mouth 2 (two) times daily for 5 days. 10 tablet 0   hydrOXYzine (VISTARIL) 25 MG capsule Take 1 capsule (25 mg total) by mouth every 8 (eight) hours as needed. 30 capsule 0   No current facility-administered medications for this visit.   Known Allergies:  No Known Allergies Past Surgical History: Past Surgical History:  Procedure Laterality Date   APPENDECTOMY     ESOPHAGOGASTRODUODENOSCOPY N/A 07/28/2014   Procedure: ESOPHAGOGASTRODUODENOSCOPY (EGD);  Surgeon: Dorena Cookey, MD;  Location[facility] ENDOSCOPY;  Service: Endoscopy;  Laterality: N/A;   esophagus stretched  04/05/2017   FOREIGN BODY REMOVAL N/A 07/28/2014   Procedure: FOREIGN BODY REMOVAL;  Surgeon: Dorena Cookey, MD;  Location[facility] ENDOSCOPY;  Service: Endoscopy;  Laterality: N/A;   TONSILLECTOMY     WISDOM TOOTH EXTRACTION     Family History: Family History  Problem Relation Age of Onset   Hypertension Mother    Diabetes Mother    Stroke Mother    Heart attack Mother    Asthma Son    Heart disease Maternal Grandfather    Heart attack Maternal Grandfather    Social History: Jason Hale lives ***.   ROS:  All other systems negative except as noted per HPI.  Objective:  There were no vitals taken for this visit. There is no height or weight on file to calculate BMI. Physical Exam:  General Appearance:  Alert, cooperative, no distress, appears stated age  Head:  Normocephalic, without obvious abnormality, atraumatic  Eyes:  Conjunctiva clear, EOM's intact  Nose: Nares normal, {Blank multiple:19196:a:"***","hypertrophic turbinates","normal mucosa","no visible anterior polyps","septum midline"}  Throat: Lips, tongue normal; teeth and gums normal, {Blank multiple:19196:a:"***","normal posterior oropharynx","tonsils 2+","tonsils 3+","no tonsillar exudate","+  cobblestoning"}  Neck: Supple, symmetrical  Lungs:   {Blank multiple:19196:a:"***","clear to auscultation bilaterally","end-expiratory wheezing","wheezing throughout"}, Respirations unlabored, {Blank multiple:19196:a:"***","no coughing","intermittent dry coughing"}  Heart:  {Blank multiple:19196:a:"***","regular rate and rhythm","no murmur"}, Appears well perfused  Extremities: No edema  Skin: Skin color, texture, turgor normal, no rashes or lesions on visualized portions of skin  Neurologic: No gross deficits     Diagnostics: Spirometry:  Tracings reviewed. His effort: {Blank single:19197::"Good reproducible efforts.","It was hard to get consistent efforts and there is a question as to whether this reflects a maximal maneuver.","Poor effort, data can not be interpreted.","Variable effort-results affected.","decent for first attempt at spirometry."} FVC: ***L (pre), ***L  (post) FEV1: ***L, ***% predicted (pre), ***L, ***% predicted (post) FEV1/FVC ratio: ***% (pre), ***% (post) Interpretation: {Blank single:19197::"Spirometry consistent with mild obstructive disease","Spirometry consistent with moderate obstructive disease","Spirometry consistent with severe obstructive disease","Spirometry consistent with possible restrictive disease","Spirometry consistent with mixed obstructive and restrictive disease","Spirometry uninterpretable due to technique","Spirometry consistent with normal pattern","No overt abnormalities noted given today's efforts"} with *** bronchodilator response  Skin Testing: {Blank single:19197::"Select foods","Environmental allergy panel","Environmental allergy panel and select foods","Food allergy panel","None","Deferred due to recent antihistamines use"}. *** Adequate controls. Results discussed with patient/family.   {Blank single:19197::"Allergy testing results were read and interpreted by myself, documented by clinical staff."," "}  Assessment and Plan  There are  no Patient Instructions on file for this visit.  {Blank single:19197::"This note in its entirety was forwarded to the Provider who requested this consultation."}  Thank you for your kind referral. I appreciate the opportunity to take part in Jason Hale's care. Please do not hesitate to contact me with questions.***  Sincerely,  Tonny Bollman, MD [facility]

## 2021-02-01 ENCOUNTER — Ambulatory Visit: Payer: Self-pay | Admitting: Internal Medicine
